# Patient Record
Sex: Female | Born: 2000 | Race: Black or African American | Hispanic: No | Marital: Single | Smoking: Never smoker
Health system: Southern US, Community
[De-identification: ages and names within clinical notes are randomized; demographics above are authoritative.]

## PROBLEM LIST (undated history)

## (undated) ENCOUNTER — Ambulatory Visit: Admission: EM | Payer: Medicaid Other

## (undated) ENCOUNTER — Ambulatory Visit: Source: Home / Self Care

## (undated) HISTORY — PX: NO PAST SURGERIES: SHX2092

---
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
## 2022-01-10 ENCOUNTER — Other Ambulatory Visit: Payer: Self-pay

## 2022-01-10 ENCOUNTER — Ambulatory Visit
Admission: EM | Admit: 2022-01-10 | Discharge: 2022-01-10 | Disposition: A | Discharge status: Other Acute Inpatient Hospital | Payer: Medicaid Other | Attending: Emergency Medicine | Admitting: Emergency Medicine

## 2022-01-10 NOTE — Discharge Instructions (Signed)
NP

## 2022-01-12 ENCOUNTER — Ambulatory Visit: Payer: Medicaid Other

## 2022-02-17 ENCOUNTER — Encounter: Payer: Self-pay | Admitting: Emergency Medicine

## 2022-02-17 ENCOUNTER — Other Ambulatory Visit: Payer: Self-pay

## 2022-02-17 ENCOUNTER — Ambulatory Visit
Admission: EM | Admit: 2022-02-17 | Discharge: 2022-02-17 | Disposition: A | Discharge status: Home / Self Care | Payer: Medicaid Other | Attending: Internal Medicine | Admitting: Internal Medicine

## 2022-02-17 DIAGNOSIS — N898 Other specified noninflammatory disorders of vagina: Secondary | ICD-10-CM | POA: Insufficient documentation

## 2022-02-17 DIAGNOSIS — Z113 Encounter for screening for infections with a predominantly sexual mode of transmission: Secondary | ICD-10-CM | POA: Diagnosis not present

## 2022-02-17 NOTE — ED Triage Notes (Signed)
Pt said right when her period started 5 to 7 days ago she began to have a smell and some discharge. No concern for STD. Pt said hx of BV and yeast infection.  ?

## 2022-02-17 NOTE — ED Provider Notes (Signed)
?[facility] ? ? ? ?CSN: [id number] ?Arrival date & time: 02/17/22  1627 ? ? ?  ? ?History   ?Chief Complaint ?Chief Complaint  ?Patient presents with  ? Vaginal Discharge  ? vaginal smell  ? ? ?HPI ?Deborah Perry is a 21 y.o. female.  ? ?Patient presents with vaginal discharge and vaginal odor that started approximately 5 to 7 days ago at start of menstrual cycle.  Denies urinary burning, urinary frequency, abdominal pain, fever, back pain.  Denies any known exposure to STD but patient is sexually active and has had unprotected sexual intercourse.  Patient reports history of recurrent BV and yeast infection.   ? ? ?Vaginal Discharge ? ?History reviewed. No pertinent past medical history. ? ?Patient Active Problem List  ? Diagnosis Date Noted  ? 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 Vaginal bleeding 12/12/2019  ? Chlamydia 12/12/2019  ? Rash 10/23/2017  ? Obesity 08/21/2015  ? ? ?Past Surgical History:  ?Procedure Laterality Date  ? NO PAST SURGERIES    ? ? ?OB History   ? ? Gravida  ?0  ? Para  ?0  ? Term  ?0  ? Preterm  ?0  ? AB  ?0  ? Living  ?0  ?  ? ? SAB  ?0  ? IAB  ?0  ? Ectopic  ?0  ? Multiple  ?0  ? Live Births  ?0  ?   ?  ?  ? ? ? ?Home Medications   ? ?Prior to Admission medications   ?Medication Sig Start Date End Date Taking? Authorizing Provider  ?norelgestromin-ethinyl estradiol [name]) 150-35 MCG/24HR transdermal patch Place 1 patch onto the skin once a week. 02/01/21   Lajean Manes, CNM  ? ? ?Family History ?Family History  ?Problem Relation Age of Onset  ? Kidney Stones Mother   ? Colon cancer Mother   ? ? ?Social History ?Social History  ? ?Tobacco Use  ? Smoking status: Passive Smoke Exposure - Never Smoker  ? Smokeless tobacco: Never  ?Vaping Use  ? Vaping Use: Never used  ?Substance Use Topics  ? Alcohol use: No  ? Drug use: No  ? ? ? ?Allergies   ?Patient has no known allergies. ? ? ?Review of Systems ?Review of Systems ?Per HPI ? ?Physical Exam ?Triage Vital Signs ?ED Triage Vitals   ?Enc Vitals Group  ?   BP 02/17/22 1734 121/86  ?   Pulse Rate 02/17/22 1734 67  ?   Resp 02/17/22 1734 20  ?   Temp 02/17/22 1734 98.1 ?F (36.7 ?C)  ?   Temp Source 02/17/22 1734 Oral  ?   SpO2 02/17/22 1734 98 %  ?   Weight --   ?   Height --   ?   Head Circumference --   ?   Peak Flow --   ?   Pain Score 02/17/22 1735 0  ?   Pain Loc --   ?   Pain Edu? --   ?   Excl. in [place]? --   ? ?No data found. ? ?Updated Vital Signs ?BP 121/86 (BP Location: Left Arm)   Pulse 67   Temp 98.1 ?F (36.7 ?C) (Oral)   Resp 20   LMP 02/10/2022 (Exact Date)   SpO2 98%  ? ?Visual Acuity ?Right Eye Distance:   ?Left Eye Distance:   ?Bilateral Distance:   ? ?Right Eye Near:   ?Left Eye Near:    ?Bilateral Near:    ? ?  Physical Exam ?Constitutional:   ?   General: She is not in acute distress. ?   Appearance: Normal appearance. She is not toxic-appearing or diaphoretic.  ?HENT:  ?   Head: Normocephalic and atraumatic.  ?Eyes:  ?   Extraocular Movements: Extraocular movements intact.  ?   Conjunctiva/sclera: Conjunctivae normal.  ?Cardiovascular:  ?   Rate and Rhythm: Normal rate and regular rhythm.  ?   Pulses: Normal pulses.  ?   Heart sounds: Normal heart sounds.  ?Pulmonary:  ?   Effort: Pulmonary effort is normal. No respiratory distress.  ?   Breath sounds: Normal breath sounds.  ?Abdominal:  ?   General: Abdomen is flat. Bowel sounds are normal. There is no distension.  ?   Palpations: Abdomen is soft.  ?   Tenderness: There is no abdominal tenderness.  ?Genitourinary: ?   Comments: Deferred with shared decision making.  Self swab performed. ?Neurological:  ?   General: No focal deficit present.  ?   Mental Status: She is alert and oriented to person, place, and time. Mental status is at baseline.  ?Psychiatric:     ?   Mood and Affect: Mood normal.     ?   Behavior: Behavior normal.     ?   Thought Content: Thought content normal.     ?   Judgment: Judgment normal.  ? ? ? ?UC Treatments / Results  ?Labs ?(all labs ordered are  listed, but only abnormal results are displayed) ?Labs Reviewed  ?CERVICOVAGINAL ANCILLARY ONLY  ? ? ?EKG ? ? ?Radiology ?No results found. ? ?Procedures ?Procedures (including critical care time) ? ?Medications Ordered in UC ?Medications - No data to display ? ?Initial Impression / Assessment and Plan / UC Course  ?I have reviewed the triage vital signs and the nursing notes. ? ?Pertinent labs & imaging results that were available during my care of the patient were reviewed by me and considered in my medical decision making (see chart for details). ? ?  ? ?Suspect vaginitis.  Cervicovaginal swab pending.  Will await results for treatment.  Discussed return precautions.  Patient verbalized understanding and was agreeable with plan. ?Final Clinical Impressions(s) / UC Diagnoses  ? ?Final diagnoses:  ?Vaginal discharge  ?Screening examination for venereal disease  ? ? ? ?Discharge Instructions   ? ?  ?Your vaginal swab is pending.  We will call if it is positive and treat as appropriate.  Please refrain from sexual activity until test results and treatment are complete. ? ? ? ?ED Prescriptions   ?None ?  ? ?PDMP not reviewed this encounter. ?  ?Teodora Medici, [place] ?02/17/22 1813 ? ?

## 2022-02-17 NOTE — Discharge Instructions (Signed)
Your vaginal swab is pending.  We will call if it is positive and treat as appropriate.  Please refrain from sexual activity until test results and treatment are complete. ?

## 2022-02-21 ENCOUNTER — Telehealth (HOSPITAL_COMMUNITY): Payer: Self-pay | Admitting: Emergency Medicine

## 2022-02-21 LAB — CERVICOVAGINAL ANCILLARY ONLY
Bacterial Vaginitis (gardnerella): POSITIVE — AB
Candida Glabrata: NEGATIVE
Candida Vaginitis: NEGATIVE
Chlamydia: NEGATIVE
Comment: NEGATIVE
Comment: NEGATIVE
Comment: NEGATIVE
Comment: NEGATIVE
Comment: NEGATIVE
Comment: NORMAL
Neisseria Gonorrhea: NEGATIVE
Trichomonas: NEGATIVE

## 2022-02-21 MED ORDER — METRONIDAZOLE 500 MG PO TABS: 500.0000 mg | ORAL_TABLET | Freq: Two times a day (BID) | ORAL | 0 refills | Status: DC

## 2022-03-16 ENCOUNTER — Telehealth: Payer: Medicaid Other | Admitting: Physician Assistant

## 2022-03-16 DIAGNOSIS — N760 Acute vaginitis: Secondary | ICD-10-CM | POA: Diagnosis not present

## 2022-03-16 DIAGNOSIS — B9689 Other specified bacterial agents as the cause of diseases classified elsewhere: Secondary | ICD-10-CM

## 2022-03-16 MED ORDER — METRONIDAZOLE 0.75 % VA GEL: 1.0000 | Freq: Two times a day (BID) | VAGINAL | 0 refills | Status: DC

## 2022-03-16 NOTE — Patient Instructions (Signed)
?  Chauncey Cruel, thank you for joining Margaretann Loveless, PA-C for today's virtual visit.  While this provider is not your primary care provider (PCP), if your PCP is located in our provider database this encounter information will be shared with them immediately following your visit. ? ?Consent: ?(Patient) Deborah Perry provided verbal consent for this virtual visit at the beginning of the encounter. ? ?Current Medications: ? ?Current Outpatient Medications:  ?  metroNIDAZOLE (METROGEL VAGINAL) 0.75 % vaginal gel, Place 1 Applicatorful vaginally 2 (two) times daily. X 5 days, Disp: 140 g, Rfl: 0 ?  norelgestromin-ethinyl estradiol Burr Medico) 150-35 MCG/24HR transdermal patch, Place 1 patch onto the skin once a week., Disp: 3 patch, Rfl: 12  ? ?Medications ordered in this encounter:  ?Meds ordered this encounter  ?Medications  ? metroNIDAZOLE (METROGEL VAGINAL) 0.75 % vaginal gel  ?  Sig: Place 1 Applicatorful vaginally 2 (two) times daily. X 5 days  ?  Dispense:  140 g  ?  Refill:  0  ?  Order Specific Question:   Supervising Provider  ?  Answer:   Eber Hong [[id number]]  ?  ? ?*If you need refills on other medications prior to your next appointment, please contact your pharmacy* ? ?Follow-Up: ?Call back or seek an in-person evaluation if the symptoms worsen or if the condition fails to improve as anticipated. ? ?Other Instructions ?Vaginal Probiotics: ?AZO vaginal probiotic ?OLLY Happy Hoo-Ha ?RAW Vaginal Care ?RenewLife Women's vaginal probiotic ?RepHresh Pro-B ? ?Vaginal washes: ?Honey Pot ?Summer's Eve ?Vagisil Feminine cleanser ? ? ?If you have been instructed to have an in-person evaluation today at a local Urgent Care facility, please use the link below. It will take you to a list of all of our available [facility], including address, phone number and hours of operation. Please do not delay care.  ?[facility] ? ?If you or a family member do not have a primary care provider,  use the link below to schedule a visit and establish care. When you choose a [facility] primary care physician or advanced practice provider, you gain a long-term partner in health. ?Find a Primary Care Provider ? ?Learn more about [facility]'s in-office and virtual care options: ?[facility] - Get Care Now ?

## 2022-03-16 NOTE — Progress Notes (Signed)
?Virtual Visit Consent  ? ?Deborah Perry, you are scheduled for a virtual visit with a [facility] provider today.   ?  ?Just as with appointments in the office, your consent must be obtained to participate.  Your consent will be active for this visit and any virtual visit you may have with one of our providers in the next 365 days.   ?  ?If you have a MyChart account, a copy of this consent can be sent to you electronically.  All virtual visits are billed to your insurance company just like a traditional visit in the office.   ? ?As this is a virtual visit, video technology does not allow for your provider to perform a traditional examination.  This may limit your provider's ability to fully assess your condition.  If your provider identifies any concerns that need to be evaluated in person or the need to arrange testing (such as labs, EKG, etc.), we will make arrangements to do so.   ?  ?Although advances in technology are sophisticated, we cannot ensure that it will always work on either your end or our end.  If the connection with a video visit is poor, the visit may have to be switched to a telephone visit.  With either a video or telephone visit, we are not always able to ensure that we have a secure connection.    ? ?I need to obtain your verbal consent now.   Are you willing to proceed with your visit today?  ?  ?Deborah Perry has provided verbal consent on 03/16/2022 for a virtual visit (video or telephone). ?  ?Deborah Daring, PA-C  ? ?Date: 03/16/2022 1:26 PM ? ? ?Virtual Visit via Video Note  ? Deborah Perry, connected with  Deborah Perry  ([id number], Jan 02, 2001) on 03/16/22 at  1:15 PM EDT by a video-enabled telemedicine application and verified that I am speaking with the correct person using two identifiers. ? ?Location: ?Patient: Virtual Visit Location Patient: Home ?Provider: Virtual Visit Location Provider: Home Office ?  ?I discussed the limitations of evaluation and  management by telemedicine and the availability of in person appointments. The patient expressed understanding and agreed to proceed.   ? ?History of Present Illness: ?Deborah Perry is a 21 y.o. who identifies as a female who was assigned female at birth, and is being seen today for vaginal discharge. ? ?HPI: Vaginal Discharge ?The patient's primary symptoms include a genital odor and vaginal discharge. This is a recurrent problem. The current episode started 1 to 4 weeks ago. The problem occurs constantly. The problem has been unchanged. The patient is experiencing no pain. She is not pregnant. Pertinent negatives include no back pain, chills, fever, flank pain, frequency or nausea. The vaginal discharge was watery, white and malodorous. There has been no bleeding. Nothing aggravates the symptoms. She has tried antibiotics for the symptoms. The treatment provided no relief. She is sexually active (not since last testing).   ? ? ?Problems:  ?Patient Active Problem List  ? Diagnosis Date Noted  ? 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 Vaginal bleeding 12/12/2019  ? Chlamydia 12/12/2019  ? Rash 10/23/2017  ? Obesity 08/21/2015  ?  ?Allergies: No Known Allergies ?Medications:  ?Current Outpatient Medications:  ?  metroNIDAZOLE (METROGEL VAGINAL) 0.75 % vaginal gel, Place 1 Applicatorful vaginally 2 (two) times daily. X 5 days, Disp: 140 g, Rfl: 0 ?  norelgestromin-ethinyl estradiol [name]) 150-35 MCG/24HR transdermal patch, Place 1 patch onto the skin once a week.,  Disp: 3 patch, Rfl: 12 ? ?Observations/Objective: ?Patient is well-developed, well-nourished in no acute distress.  ?Resting comfortably at home.  ?Head is normocephalic, atraumatic.  ?No labored breathing.  ?Speech is clear and coherent with logical content.  ?Patient is alert and oriented at baseline.  ? ? ?Assessment and Plan: ?1. BV (bacterial vaginosis) ?- metroNIDAZOLE (METROGEL VAGINAL) 0.75 % vaginal gel; Place 1 Applicatorful vaginally 2 (two) times daily.  X 5 days  Dispense: 140 g; Refill: 0 ? ?- Recently treated and symptoms cleared, but started menstrual cycle and all symptoms returned near ending of menstrual cycle ?- Metrogel prescribed ?- Discussed vaginal hygiene ?- Follow up as needed ? ?Follow Up Instructions: ?I discussed the assessment and treatment plan with the patient. The patient was provided an opportunity to ask questions and all were answered. The patient agreed with the plan and demonstrated an understanding of the instructions.  A copy of instructions were sent to the patient via MyChart unless otherwise noted below.  ? ? ?The patient was advised to call back or seek an in-person evaluation if the symptoms worsen or if the condition fails to improve as anticipated. ? ?Time:  ?I spent 11 minutes with the patient via telehealth technology discussing the above problems/concerns.   ? ?Deborah Daring, PA-C ?

## 2022-05-26 ENCOUNTER — Other Ambulatory Visit: Payer: Self-pay | Admitting: Physician Assistant

## 2022-05-26 DIAGNOSIS — B9689 Other specified bacterial agents as the cause of diseases classified elsewhere: Secondary | ICD-10-CM

## 2022-06-16 ENCOUNTER — Telehealth: Payer: Medicaid Other | Admitting: Physician Assistant

## 2022-06-16 DIAGNOSIS — R3989 Other symptoms and signs involving the genitourinary system: Secondary | ICD-10-CM

## 2022-06-17 MED ORDER — CEPHALEXIN 500 MG PO CAPS: 500.0000 mg | ORAL_CAPSULE | Freq: Two times a day (BID) | ORAL | 0 refills | Status: AC

## 2022-06-17 NOTE — Progress Notes (Signed)
I have spent 5 minutes in review of e-visit questionnaire, review and updating patient chart, medical decision making and response to patient.   Arif Amendola Cody Nahun Kronberg, PA-C    

## 2022-06-17 NOTE — Progress Notes (Signed)

## 2022-07-06 ENCOUNTER — Telehealth: Payer: Medicaid Other | Admitting: Family Medicine

## 2022-07-06 DIAGNOSIS — R300 Dysuria: Secondary | ICD-10-CM | POA: Diagnosis not present

## 2022-07-07 NOTE — Progress Notes (Signed)
[place]   Recent UTI- reoccurring in under 30 days- needs to have testing to get best medication/ treatment plan

## 2022-07-08 MED ORDER — NITROFURANTOIN MONOHYD MACRO 100 MG PO CAPS: 100.0000 mg | ORAL_CAPSULE | Freq: Two times a day (BID) | ORAL | 0 refills | Status: DC

## 2022-07-08 NOTE — Addendum Note (Signed)
Addended by: Margaretann Loveless on: 07/08/2022 02:19 PM   Modules accepted: Orders, Level of Service

## 2022-07-08 NOTE — Progress Notes (Signed)
E-Visit for Urinary Problems  We are sorry that you are not feeling well.  Here is how we plan to help!  Based on what you shared with me it looks like you most likely have a simple urinary tract infection.  A UTI (Urinary Tract Infection) is a bacterial infection of the bladder.  Most cases of urinary tract infections are simple to treat but a key part of your care is to encourage you to drink plenty of fluids and watch your symptoms carefully.  I have prescribed MacroBid 100 mg twice a day for 5 days.  Your symptoms should gradually improve. Call us if the burning in your urine worsens, you develop worsening fever, back pain or pelvic pain or if your symptoms do not resolve after completing the antibiotic.  Urinary tract infections can be prevented by drinking plenty of water to keep your body hydrated.  Also be sure when you wipe, wipe from front to back and don't hold it in!  If possible, empty your bladder every 4 hours.  We will treat this time, but if recurs again in less than 30 days, you must be seen in person. Also, make sure to disinfect toys with appropriate cleaners to avoid reinfection.   HOME CARE Drink plenty of fluids Compete the full course of the antibiotics even if the symptoms resolve Remember, when you need to go.go. Holding in your urine can increase the likelihood of getting a UTI! GET HELP RIGHT AWAY IF: You cannot urinate You get a high fever Worsening back pain occurs You see blood in your urine You feel sick to your stomach or throw up You feel like you are going to pass out  MAKE SURE YOU  Understand these instructions. Will watch your condition. Will get help right away if you are not doing well or get worse.   Thank you for choosing an e-visit.  Your e-visit answers were reviewed by a board certified advanced clinical practitioner to complete your personal care plan. Depending upon the condition, your plan could have included both over the counter or  prescription medications.  Please review your pharmacy choice. Make sure the pharmacy is open so you can pick up prescription now. If there is a problem, you may contact your provider through [employer] and have the prescription routed to another pharmacy.  Your safety is important to [place]. If you have drug allergies check your prescription carefully.   For the next 24 hours you can use MyChart to ask questions about today's visit, request a non-urgent call back, or ask for a work or school excuse. You will get an email in the next two days asking about your experience. I hope that your e-visit has been valuable and will speed your recovery.  I provided 5 minutes of non face-to-face time during this encounter for chart review and documentation.

## 2022-09-03 ENCOUNTER — Encounter: Payer: Self-pay | Admitting: Emergency Medicine

## 2022-09-03 ENCOUNTER — Ambulatory Visit
Admission: EM | Admit: 2022-09-03 | Discharge: 2022-09-03 | Disposition: A | Discharge status: Home / Self Care | Payer: Medicaid Other | Attending: Physician Assistant | Admitting: Physician Assistant

## 2022-09-03 DIAGNOSIS — N3000 Acute cystitis without hematuria: Secondary | ICD-10-CM | POA: Insufficient documentation

## 2022-09-03 LAB — POCT URINALYSIS DIP (MANUAL ENTRY)
Bilirubin, UA: NEGATIVE
Glucose, UA: NEGATIVE mg/dL
Ketones, POC UA: NEGATIVE mg/dL
Nitrite, UA: NEGATIVE
Protein Ur, POC: 100 mg/dL — AB
Spec Grav, UA: 1.025 (ref 1.010–1.025)
Urobilinogen, UA: 0.2 E.U./dL
pH, UA: 7 (ref 5.0–8.0)

## 2022-09-03 LAB — POCT URINE PREGNANCY: Preg Test, Ur: NEGATIVE

## 2022-09-03 MED ORDER — NITROFURANTOIN MONOHYD MACRO 100 MG PO CAPS: 100.0000 mg | ORAL_CAPSULE | Freq: Two times a day (BID) | ORAL | 0 refills | Status: DC

## 2022-09-03 NOTE — ED Provider Notes (Signed)
[name]    CSN: [id number] Arrival date & time: 09/03/22  3875      History   Chief Complaint Chief Complaint  Patient presents with   Urinary Tract Infection    HPI Deborah Perry is a 21 y.o. female.   Patient here today for evaluation of possible UTI.  She reports that for the last week she has had some discomfort with urination as well as urinary frequency.  She reports yesterday symptoms worsened and she has had some cramping.  She has not had any back pain.  She denies any fever or chills.  She denies any nausea or vomiting.  She does not report treatment for symptoms.  The history is provided by the patient.    History reviewed. No pertinent past medical history.  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 Authorizing Provider  nitrofurantoin, macrocrystal-monohydrate, (MACROBID) 100 MG capsule Take 1 capsule (100 mg total) by mouth 2 (two) times daily. 09/03/22  Yes Francene Finders, PA-C  norelgestromin-ethinyl estradiol [name]) 150-35 MCG/24HR transdermal patch Place 1 patch onto the skin once a week. 02/01/21   Lajean Manes, CNM    Family History Family History  Problem Relation Age of Onset   Kidney Stones Mother    Colon cancer Mother     Social History Social History   Tobacco Use   Smoking status: Passive Smoke Exposure - Never Smoker   Smokeless tobacco: Never  Vaping Use   Vaping Use: Never used  Substance Use Topics   Alcohol use: No    Drug use: No     Allergies   Patient has no known allergies.   Review of Systems Review of Systems  Constitutional:  Negative for chills and fever.  Eyes:  Negative for discharge and redness.  Gastrointestinal:  Positive for abdominal pain. Negative for nausea and vomiting.  Genitourinary:  Positive for dysuria and frequency.     Physical Exam Triage Vital Signs ED Triage Vitals  Enc Vitals Group     BP      Pulse      Resp      Temp      Temp src      SpO2      Weight      Height      Head Circumference      Peak Flow      Pain Score      Pain Loc      Pain Edu?      Excl. in [place]?    No data found.  Updated Vital Signs BP 116/75 (BP Location: Left Arm)   Pulse 66   Temp 98.5 F (36.9 C) (Oral)  Resp 16   LMP 08/03/2022   SpO2 99%      Physical Exam Vitals and nursing note reviewed.  Constitutional:      General: She is not in acute distress.    Appearance: Normal appearance. She is not ill-appearing.  HENT:     Head: Normocephalic and atraumatic.  Eyes:     Conjunctiva/sclera: Conjunctivae normal.  Cardiovascular:     Rate and Rhythm: Normal rate.  Pulmonary:     Effort: Pulmonary effort is normal.  Neurological:     Mental Status: She is alert.  Psychiatric:        Mood and Affect: Mood normal.        Behavior: Behavior normal.        Thought Content: Thought content normal.      UC Treatments / Results  Labs (all labs ordered are listed, but only abnormal results are displayed) Labs Reviewed  POCT URINALYSIS DIP (MANUAL ENTRY) - Abnormal; Notable for the following components:      Result Value   Color, UA light yellow (*)    Clarity, UA cloudy (*)    Blood, UA moderate (*)    Protein Ur, POC =100 (*)    Leukocytes, UA Small (1+) (*)    All other components within normal limits  URINE CULTURE  POCT URINE PREGNANCY    EKG   Radiology No results found.  Procedures Procedures (including critical care time)  Medications  Ordered in UC Medications - No data to display  Initial Impression / Assessment and Plan / UC Course  I have reviewed the triage vital signs and the nursing notes.  Pertinent labs & imaging results that were available during my care of the patient were reviewed by me and considered in my medical decision making (see chart for details).    We will treat to cover UTI given UA results. Urine pregnancy test negative.  Urine culture ordered.  Recommended follow-up with any further concerns.  Final Clinical Impressions(s) / UC Diagnoses   Final diagnoses:  Acute cystitis without hematuria   Discharge Instructions   None    ED Prescriptions     Medication Sig Dispense Auth. Provider   nitrofurantoin, macrocrystal-monohydrate, (MACROBID) 100 MG capsule Take 1 capsule (100 mg total) by mouth 2 (two) times daily. 10 capsule Tomi Bamberger, PA-C      PDMP not reviewed this encounter.   Tomi Bamberger, PA-C 09/03/22 [phone]

## 2022-09-03 NOTE — ED Triage Notes (Signed)
Pt said x 1 week has had some urination discomfort and frequency but yesterday got worse with discomfort and feeling she needs to keep going and not emptying her bladder.

## 2022-09-05 LAB — URINE CULTURE: Culture: >=100000 — AB

## 2022-10-10 NOTE — Progress Notes (Signed)
Erroneous encounter-disregard

## 2022-10-17 ENCOUNTER — Encounter: Payer: Medicaid Other | Admitting: Family

## 2022-10-17 DIAGNOSIS — Z7689 Persons encountering health services in other specified circumstances: Secondary | ICD-10-CM

## 2022-11-02 ENCOUNTER — Ambulatory Visit
Admission: EM | Admit: 2022-11-02 | Discharge: 2022-11-02 | Disposition: A | Discharge status: Home / Self Care | Payer: Medicaid Other | Attending: Physician Assistant | Admitting: Physician Assistant

## 2022-11-02 DIAGNOSIS — N898 Other specified noninflammatory disorders of vagina: Secondary | ICD-10-CM | POA: Diagnosis not present

## 2022-11-02 MED ORDER — FLUCONAZOLE 150 MG PO TABS: ORAL_TABLET | ORAL | 0 refills | Status: DC

## 2022-11-02 NOTE — ED Triage Notes (Signed)
Pt c/o vaginal odor and clumpy discharge onset ~ 1 week ago concerned for either yeast or BV states she gets these often. Not relieved w/ cranberry juice.

## 2022-11-02 NOTE — ED Provider Notes (Signed)
[name]    CSN: [id number] Arrival date & time: 11/02/22  1901      History   Chief Complaint Chief Complaint  Patient presents with   Vaginal Discharge    HPI Deborah Perry is a 21 y.o. female.   Patient here today for evaluation of vaginal discharge that is clumpy. She reports symptoms are similar to yeast or BV infections but states her discharge does not smell as bad as BV typically does. She has not had any genital rashes or lesions. She denies any abdominal pain or low back pain, nausea, vomiting, dysuria. She does not report treatment for symptoms.   The history is provided by the patient.  Vaginal Discharge Associated symptoms: no abdominal pain, no dysuria, no fever, no nausea and no vomiting     History reviewed. No pertinent past medical history.  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 Authorizing Provider  fluconazole (DIFLUCAN) 150 MG tablet Take one tab PO today and repeat dose in 3 days if symptoms persist 11/02/22  Yes Tomi Bamberger, PA-C  nitrofurantoin, macrocrystal-monohydrate, (MACROBID) 100 MG capsule Take 1 capsule (100 mg total) by mouth 2 (two) times daily. 09/03/22   Tomi Bamberger, PA-C  norelgestromin-ethinyl estradiol [employer] Medico) 150-35 MCG/24HR transdermal patch Place 1 patch onto the skin once a week. 02/01/21   Sharyon Cable, CNM    Family History Family History  Problem Relation Age of Onset   Kidney Stones Mother    Colon  cancer Mother     Social History Social History   Tobacco Use   Smoking status: Passive Smoke Exposure - Never Smoker   Smokeless tobacco: Never  Vaping Use   Vaping Use: Never used  Substance Use Topics   Alcohol use: No   Drug use: No     Allergies   Patient has no known allergies.   Review of Systems Review of Systems  Constitutional:  Negative for chills and fever.  Eyes:  Negative for discharge and redness.  Gastrointestinal:  Negative for abdominal pain, nausea and vomiting.  Genitourinary:  Positive for vaginal discharge. Negative for dysuria.     Physical Exam Triage Vital Signs ED Triage Vitals [11/02/22 1909]  Enc Vitals Group     BP 113/71     Pulse Rate 68     Resp 16     Temp 98 F (36.7 C)     Temp Source Oral     SpO2 98 %     Weight      Height      Head Circumference      Peak Flow      Pain Score 0     Pain Loc  Pain Edu?      Excl. in [place]?    No data found.  Updated Vital Signs BP 113/71 (BP Location: Left Arm)   Pulse 68   Temp 98 F (36.7 C) (Oral)   Resp 16   SpO2 98%     Physical Exam Vitals and nursing note reviewed.  Constitutional:      General: She is not in acute distress.    Appearance: Normal appearance. She is not ill-appearing.  HENT:     Head: Normocephalic and atraumatic.  Eyes:     Conjunctiva/sclera: Conjunctivae normal.  Cardiovascular:     Rate and Rhythm: Normal rate.  Pulmonary:     Effort: Pulmonary effort is normal.  Neurological:     Mental Status: She is alert.  Psychiatric:        Mood and Affect: Mood normal.        Behavior: Behavior normal.        Thought Content: Thought content normal.      UC Treatments / Results  Labs (all labs ordered are listed, but only abnormal results are displayed) Labs Reviewed  CERVICOVAGINAL ANCILLARY ONLY    EKG   Radiology No results found.  Procedures Procedures (including critical care time)  Medications Ordered in UC Medications -  No data to display  Initial Impression / Assessment and Plan / UC Course  I have reviewed the triage vital signs and the nursing notes.  Pertinent labs & imaging results that were available during my care of the patient were reviewed by me and considered in my medical decision making (see chart for details).   Diflucan prescribed as symptoms seem consistent with yeast vaginitis. Will also screen for BV, trichomonas, gonorrhea and chlamydia. Recommend follow up with any further concerns, otherwise will await results for further recommendation.    Final Clinical Impressions(s) / UC Diagnoses   Final diagnoses:  Vaginal discharge   Discharge Instructions   None    ED Prescriptions     Medication Sig Dispense Auth. Provider   fluconazole (DIFLUCAN) 150 MG tablet Take one tab PO today and repeat dose in 3 days if symptoms persist 2 tablet Tomi Bamberger, PA-C      PDMP not reviewed this encounter.   Tomi Bamberger, PA-C 11/02/22 1932

## 2022-11-04 ENCOUNTER — Telehealth (HOSPITAL_COMMUNITY): Payer: Self-pay | Admitting: Physician Assistant

## 2022-11-04 LAB — CERVICOVAGINAL ANCILLARY ONLY
Bacterial Vaginitis (gardnerella): POSITIVE — AB
Candida Glabrata: NEGATIVE
Candida Vaginitis: NEGATIVE
Chlamydia: NEGATIVE
Comment: NEGATIVE
Comment: NEGATIVE
Comment: NEGATIVE
Comment: NEGATIVE
Comment: NEGATIVE
Comment: NORMAL
Neisseria Gonorrhea: NEGATIVE
Trichomonas: NEGATIVE

## 2022-11-04 MED ORDER — METRONIDAZOLE 500 MG PO TABS: 500.0000 mg | ORAL_TABLET | Freq: Two times a day (BID) | ORAL | 0 refills | Status: DC

## 2022-11-04 NOTE — Telephone Encounter (Signed)
Positive for BV. Will send in treatment. Metronidazole prescribed.

## 2022-11-22 ENCOUNTER — Other Ambulatory Visit (HOSPITAL_COMMUNITY)
Admission: RE | Admit: 2022-11-22 | Discharge: 2022-11-22 | Disposition: A | Discharge status: Home / Self Care | Payer: Medicaid Other | Source: Ambulatory Visit | Attending: Obstetrics and Gynecology | Admitting: Obstetrics and Gynecology

## 2022-11-22 ENCOUNTER — Ambulatory Visit (INDEPENDENT_AMBULATORY_CARE_PROVIDER_SITE_OTHER): Payer: Medicaid Other | Admitting: Licensed Clinical Social Worker

## 2022-11-22 ENCOUNTER — Encounter: Payer: Self-pay | Admitting: Obstetrics and Gynecology

## 2022-11-22 ENCOUNTER — Ambulatory Visit (INDEPENDENT_AMBULATORY_CARE_PROVIDER_SITE_OTHER): Payer: Medicaid Other | Admitting: Obstetrics and Gynecology

## 2022-11-22 VITALS — BP 128/72 | HR 82 | Ht 65.0 in | Wt 162.0 lb

## 2022-11-22 DIAGNOSIS — Z113 Encounter for screening for infections with a predominantly sexual mode of transmission: Secondary | ICD-10-CM

## 2022-11-22 DIAGNOSIS — Z659 Problem related to unspecified psychosocial circumstances: Secondary | ICD-10-CM | POA: Diagnosis not present

## 2022-11-22 DIAGNOSIS — F411 Generalized anxiety disorder: Secondary | ICD-10-CM | POA: Diagnosis not present

## 2022-11-22 DIAGNOSIS — Z01419 Encounter for gynecological examination (general) (routine) without abnormal findings: Secondary | ICD-10-CM | POA: Diagnosis not present

## 2022-11-22 DIAGNOSIS — N760 Acute vaginitis: Secondary | ICD-10-CM

## 2022-11-22 NOTE — Progress Notes (Signed)
NGYN presents for annual and all STD testing. Reports reoccurring BV infections and yeast infections.  Pt is interested in counseling.  No BC desired

## 2022-11-22 NOTE — Patient Instructions (Addendum)
If today's test come's back positive for BV as well, we will do prolonged (several weeks) of treatment to try to clear it.

## 2022-11-22 NOTE — Progress Notes (Signed)
NEW GYNECOLOGY PATIENT Patient name: Deborah Perry MRN [id number]  Date of birth: 2001-10-31 Chief Complaint:   No chief complaint on file.     History:  Deborah Perry is a 21 y.o. G0P0000 being seen today for annual and recurrent BV.    Patient reports a history of at least 6 outbreaks of BV in the last year.  She has done both oral and vaginal treatments in the past.  She has noticed that she may have infection after her menses; also noted with different condom brand which she has stopped using.  Infections are usually associated with bad odor, no itching.  Currently sexually active with 1 female partner.  Currently using condoms for contraception.  Reports having previously tried boric acid for BV, with subsequent change in her menses and has not taken it again.  Has attempted OTC medications and treatments for recurrent BV. Last infection a few weeks ago, noted to have a UTI with subsequent yeast infection due to ABX use.       Gynecologic History Patient's last menstrual period was 11/12/2022. Contraception: condoms Last Pap: n/a Last Mammogram: n/a  Last Colonoscopy: n/a  Obstetric History OB History  Gravida Para Term Preterm AB Living  0 0 0 0 0 0  SAB IAB Ectopic Multiple Live Births  0 0 0 0 0    History reviewed. No pertinent past medical history.  Past Surgical History:  Procedure Laterality Date   NO PAST SURGERIES      Current Outpatient Medications on File Prior to Visit  Medication Sig Dispense Refill   fluconazole (DIFLUCAN) 150 MG tablet Take one tab PO today and repeat dose in 3 days if symptoms persist 2 tablet 0   metroNIDAZOLE (FLAGYL) 500 MG tablet Take 1 tablet (500 mg total) by mouth 2 (two) times daily. 14 tablet 0   nitrofurantoin, macrocrystal-monohydrate, (MACROBID) 100 MG capsule Take 1 capsule (100 mg total) by mouth 2 (two) times daily. 10 capsule 0   norelgestromin-ethinyl estradiol Burr Medico) 150-35 MCG/24HR transdermal patch Place 1 patch  onto the skin once a week. 3 patch 12   No current facility-administered medications on file prior to visit.    No Known Allergies  Social History:  reports that she is a non-smoker but has been exposed to tobacco smoke. She has never used smokeless tobacco. She reports that she does not drink alcohol and does not use drugs.  Family History  Problem Relation Age of Onset   Hypertension Mother    Diabetes Mother    Kidney Stones Mother    Colon cancer Mother    Stroke Maternal Grandfather     The following portions of the patient's history were reviewed and updated as appropriate: allergies, current medications, past family history, past medical history, past social history, past surgical history and problem list.  Review of Systems Pertinent items noted in HPI and remainder of comprehensive ROS otherwise negative.  Physical Exam:  BP 128/72   Pulse 82   Ht 5\' 5"  (1.651 m)   Wt 162 lb (73.5 kg)   LMP 11/12/2022   BMI 26.96 kg/m  Physical Exam Vitals and nursing note reviewed. Exam conducted with a chaperone present.  Constitutional:      Appearance: Normal appearance.  Pulmonary:     Effort: Pulmonary effort is normal.  Genitourinary:    General: Normal vulva.     Exam position: Lithotomy position.     Labia:        Right: No  rash.        Left: No rash.      Urethra: No prolapse.     Comments: Normal bilateral abia majora Nonerythematou vaginal introitus White discharge noted  Neurological:     General: No focal deficit present.     Mental Status: She is alert and oriented to person, place, and time.  Psychiatric:        Mood and Affect: Mood normal.        Behavior: Behavior normal.        Thought Content: Thought content normal.        Judgment: Judgment normal.      Assessment and Plan:   1. Routine screening for STI (sexually transmitted infection) - Hepatitis C Antibody - Hepatitis B Surface AntiGEN - RPR - HIV antibody (with reflex) -  Cervicovaginal ancillary only( [place])  2. Well woman exam No menstrual or contraceptive concerns  3. Other social stressor - Ambulatory referral to Integrated Behavioral Health  4. Recurrent vaginitis Test today, follow up results Discuss possible suppressive therapy if positive testing today  Avoidance of perfumed products in vaginal area Possible post-menstrual boric acid Review of vulvovagi     Routine preventative health maintenance measures emphasized. Please refer to After Visit Summary for other counseling recommendations.   Follow-up: No follow-ups on file.      Lorriane Shire, MD Obstetrician & Gynecologist, Faculty Practice [facility] for [employer], [facility]

## 2022-11-23 LAB — CERVICOVAGINAL ANCILLARY ONLY
Bacterial Vaginitis (gardnerella): NEGATIVE
Candida Glabrata: NEGATIVE
Candida Vaginitis: POSITIVE — AB
Chlamydia: NEGATIVE
Comment: NEGATIVE
Comment: NEGATIVE
Comment: NEGATIVE
Comment: NEGATIVE
Comment: NEGATIVE
Comment: NORMAL
Neisseria Gonorrhea: NEGATIVE
Trichomonas: NEGATIVE

## 2022-11-23 LAB — HIV ANTIBODY (ROUTINE TESTING W REFLEX): HIV Screen 4th Generation wRfx: NONREACTIVE

## 2022-11-23 LAB — RPR: RPR Ser Ql: NONREACTIVE

## 2022-11-23 LAB — HEPATITIS B SURFACE ANTIGEN: Hepatitis B Surface Ag: NEGATIVE

## 2022-11-23 LAB — HEPATITIS C ANTIBODY: Hep C Virus Ab: NONREACTIVE

## 2022-11-23 NOTE — BH Specialist Note (Signed)
Integrated Behavioral Health Initial In-Person Visit  MRN: [id number] Name: Deborah Perry  Number of Integrated Behavioral Health Clinician visits: 1 Session Start time:   2:10pm Session End time: 2:53pm Total time in minutes: 43 mins in person at [facility]  Types of Service: Individual psychotherapy  Interpretor:No. Interpretor Name and Language: none   Warm Hand Off Completed.        Subjective: Deborah Perry is a 21 y.o. female accompanied by n/a Patient was referred by Dr Briscoe Deutscher for anxiety. Patient reports the following symptoms/concerns:  Duration of problem: approx 6 months; Severity of problem: mild  Objective: Mood: good and Affect: Appropriate Risk of harm to self or others: No plan to harm self or others  Life Context: Family and Social: Lives with boyfriend  School/Work: Work at home  Self-Care: n/a Life Changes: Moved into new apartment  Patient and/or Family's Strengths/Protective Factors: Concrete supports in place (healthy food, safe environments, etc.)  Goals Addressed: Patient will: Reduce symptoms of: anxiety Increase knowledge and/or ability of: coping skills  Demonstrate ability to: Increase healthy adjustment to current life circumstances  Progress towards Goals: Ongoing  Interventions: Interventions utilized: Motivational Interviewing and Supportive Counseling  Standardized Assessments completed: Not Needed  Patient and/or Family Response: Ms. Bernath reports feeling anxious, and often worries about various things and feeling nervious.   Assessment: Patient currently experiencing general anxiety disorder.   Patient may benefit from integrated behavioral health.  Plan: Follow up with behavioral health clinician on : as needed Behavioral recommendations: Practice mindfulness and deep breathing exercise. Communicate needs for support   Referral(s): Integrated [facility] (In Clinic) "From scale of 1-10, how likely  are you to follow plan?":    Gwyndolyn Saxon, LCSW

## 2022-11-24 ENCOUNTER — Other Ambulatory Visit: Payer: Self-pay | Admitting: Obstetrics and Gynecology

## 2022-11-24 DIAGNOSIS — N760 Acute vaginitis: Secondary | ICD-10-CM

## 2022-11-24 MED ORDER — FLUCONAZOLE 150 MG PO TABS: 150.0000 mg | ORAL_TABLET | Freq: Once | ORAL | 3 refills | Status: AC

## 2022-12-02 ENCOUNTER — Telehealth: Payer: Medicaid Other | Admitting: Family Medicine

## 2022-12-02 DIAGNOSIS — N760 Acute vaginitis: Secondary | ICD-10-CM

## 2022-12-02 NOTE — Progress Notes (Signed)
[place]   Recurrent  and recent treatment that failed

## 2022-12-05 ENCOUNTER — Inpatient Hospital Stay: Admission: RE | Admit: 2022-12-05 | Payer: Medicaid Other | Source: Ambulatory Visit

## 2023-01-03 ENCOUNTER — Ambulatory Visit
Admission: RE | Admit: 2023-01-03 | Discharge: 2023-01-03 | Disposition: A | Discharge status: Home / Self Care | Payer: Medicaid Other | Source: Ambulatory Visit | Attending: Physician Assistant | Admitting: Physician Assistant

## 2023-01-03 ENCOUNTER — Ambulatory Visit: Payer: Medicaid Other

## 2023-01-03 VITALS — BP 139/74 | HR 83 | Temp 98.3°F | Resp 18

## 2023-01-03 DIAGNOSIS — Z3202 Encounter for pregnancy test, result negative: Secondary | ICD-10-CM | POA: Diagnosis not present

## 2023-01-03 DIAGNOSIS — N898 Other specified noninflammatory disorders of vagina: Secondary | ICD-10-CM | POA: Insufficient documentation

## 2023-01-03 LAB — POCT URINE PREGNANCY: Preg Test, Ur: NEGATIVE

## 2023-01-03 NOTE — ED Provider Notes (Signed)
[name]    CSN: [id number] Arrival date & time: 01/03/23  1446      History   Chief Complaint Chief Complaint  Patient presents with   Vaginal Odor    HPI Deborah Perry is a 22 y.o. female.   Patient here today for evaluation of abnormal vaginal discharge with odor that started 2 days ago. She has not  had any genital lesions or rash. She was treated for both yeast as well as BV in the past few months. She requests screening for pregnancy.   The history is provided by the patient.    History reviewed. No pertinent past medical history.  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 Authorizing Provider  fluconazole (DIFLUCAN) 150 MG tablet Take one tab PO today and repeat dose in 3 days if symptoms persist 11/02/22   Francene Finders, PA-C  metroNIDAZOLE (FLAGYL) 500 MG tablet Take 1 tablet (500 mg total) by mouth 2 (two) times daily. 11/04/22   Francene Finders, PA-C  nitrofurantoin, macrocrystal-monohydrate, (MACROBID) 100 MG capsule Take 1 capsule (100 mg total) by mouth 2 (two) times daily. 09/03/22   Francene Finders, PA-C  norelgestromin-ethinyl estradiol [name]) 150-35 MCG/24HR transdermal patch Place 1 patch onto the skin once a week. 02/01/21   Lajean Manes, CNM    Family History Family History  Problem Relation Age of Onset   Hypertension Mother    Diabetes Mother    Kidney Stones Mother    Colon cancer Mother    Stroke Maternal Grandfather      Social History Social History   Tobacco Use   Smoking status: Passive Smoke Exposure - Never Smoker   Smokeless tobacco: Never  Vaping Use   Vaping Use: Never used  Substance Use Topics   Alcohol use: No   Drug use: No     Allergies   Patient has no known allergies.   Review of Systems Review of Systems  Constitutional:  Negative for chills and fever.  Eyes:  Negative for discharge and redness.  Respiratory:  Negative for shortness of breath.   Gastrointestinal:  Negative for abdominal pain, nausea and vomiting.  Genitourinary:  Positive for vaginal discharge. Negative for genital sores.     Physical Exam Triage Vital Signs ED Triage Vitals  Enc Vitals Group     BP 01/03/23 1458 139/74     Pulse Rate 01/03/23 1458 83     Resp 01/03/23 1458 18     Temp 01/03/23 1458 98.3 F (36.8 C)     Temp Source 01/03/23 1458 Oral     SpO2 01/03/23 1458 98 %     Weight --      Height --  Head Circumference --      Peak Flow --      Pain Score 01/03/23 1500 0     Pain Loc --      Pain Edu? --      Excl. in [place]? --    No data found.  Updated Vital Signs BP 139/74 (BP Location: Left Arm)   Pulse 83   Temp 98.3 F (36.8 C) (Oral)   Resp 18   LMP 12/10/2022   SpO2 98%      Physical Exam Vitals and nursing note reviewed.  Constitutional:      General: She is not in acute distress.    Appearance: Normal appearance. She is not ill-appearing.  HENT:     Head: Normocephalic and atraumatic.  Eyes:     Conjunctiva/sclera: Conjunctivae normal.  Cardiovascular:     Rate and Rhythm: Normal rate.  Pulmonary:     Effort: Pulmonary effort is normal. No respiratory distress.  Neurological:     Mental Status: She is alert.  Psychiatric:        Mood and Affect: Mood normal.        Behavior: Behavior normal.        Thought Content: Thought content normal.      UC Treatments / Results  Labs (all labs ordered are listed, but only abnormal results are  displayed) Labs Reviewed  POCT URINE PREGNANCY  CERVICOVAGINAL ANCILLARY ONLY    EKG   Radiology No results found.  Procedures Procedures (including critical care time)  Medications Ordered in UC Medications - No data to display  Initial Impression / Assessment and Plan / UC Course  I have reviewed the triage vital signs and the nursing notes.  Pertinent labs & imaging results that were available during my care of the patient were reviewed by me and considered in my medical decision making (see chart for details).    Pregnancy test negative. Cervical swab ordered for screening for STD as well as BV and yeast. Will await results for further recommendation and encouraged follow up with any further concerns. Patient reports she just had blood work for STD screening at [employer] office, declines same today.   Final Clinical Impressions(s) / UC Diagnoses   Final diagnoses:  Vaginal discharge  Pregnancy examination or test, negative result     Discharge Instructions       Pregnancy test negative. Recommend retesting in one week if you do not start your menstrual period.   Please follow up with any further concerns.      ED Prescriptions   None    PDMP not reviewed this encounter.   Francene Finders, PA-C 01/03/23 1523

## 2023-01-03 NOTE — ED Triage Notes (Signed)
Pt presents with recurrent  abnormal vaginal odor with a slight discharge X 2 days.

## 2023-01-03 NOTE — Discharge Instructions (Signed)
  Pregnancy test negative. Recommend retesting in one week if you do not start your menstrual period.   Please follow up with any further concerns.

## 2023-01-04 LAB — CERVICOVAGINAL ANCILLARY ONLY
Bacterial Vaginitis (gardnerella): POSITIVE — AB
Candida Glabrata: NEGATIVE
Candida Vaginitis: POSITIVE — AB
Chlamydia: NEGATIVE
Comment: NEGATIVE
Comment: NEGATIVE
Comment: NEGATIVE
Comment: NEGATIVE
Comment: NEGATIVE
Comment: NORMAL
Neisseria Gonorrhea: NEGATIVE
Trichomonas: NEGATIVE

## 2023-01-05 ENCOUNTER — Telehealth (HOSPITAL_COMMUNITY): Payer: Self-pay | Admitting: Emergency Medicine

## 2023-01-05 MED ORDER — FLUCONAZOLE 150 MG PO TABS: 150.0000 mg | ORAL_TABLET | Freq: Once | ORAL | 0 refills | Status: AC

## 2023-01-05 MED ORDER — METRONIDAZOLE 500 MG PO TABS: 500.0000 mg | ORAL_TABLET | Freq: Two times a day (BID) | ORAL | 0 refills | Status: DC

## 2023-01-25 ENCOUNTER — Ambulatory Visit
Admission: EM | Admit: 2023-01-25 | Discharge: 2023-01-25 | Disposition: A | Discharge status: Home / Self Care | Payer: Medicaid Other | Attending: Family Medicine | Admitting: Family Medicine

## 2023-01-25 DIAGNOSIS — N898 Other specified noninflammatory disorders of vagina: Secondary | ICD-10-CM | POA: Insufficient documentation

## 2023-01-25 MED ORDER — METRONIDAZOLE 500 MG PO TABS: 500.0000 mg | ORAL_TABLET | Freq: Two times a day (BID) | ORAL | 0 refills | Status: DC

## 2023-01-25 MED ORDER — FLUCONAZOLE 150 MG PO TABS: ORAL_TABLET | ORAL | 0 refills | Status: DC

## 2023-01-25 NOTE — ED Triage Notes (Signed)
Pt presents with abnormal vaginal discharge X 3 days.

## 2023-01-25 NOTE — ED Provider Notes (Signed)
[place]   [id number] 01/25/23 Arrival Time: [id number]  ASSESSMENT & PLAN:  1. Vaginal discharge    Meds ordered this encounter  Medications   metroNIDAZOLE (FLAGYL) 500 MG tablet    Sig: Take 1 tablet (500 mg total) by mouth 2 (two) times daily.    Dispense:  14 tablet    Refill:  0   fluconazole (DIFLUCAN) 150 MG tablet    Sig: Take one tablet by mouth as a single dose. May repeat in 3 days if symptoms persist.    Dispense:  2 tablet    Refill:  0      Discharge Instructions      We have sent testing for sexually transmitted infections. We will notify you of any positive results once they are received. If required, we will prescribe any medications you might need.  Please refrain from all sexual activity for at least the next seven days.     Without s/s of PID.  Labs Reviewed  CERVICOVAGINAL ANCILLARY ONLY    Will notify of any positive results. Instructed to refrain from sexual activity for at least seven days.  Reviewed expectations re: course of current medical issues. Questions answered. Outlined signs and symptoms indicating need for more acute intervention. Patient verbalized understanding. After Visit Summary given.   SUBJECTIVE:  Deborah Perry is a 22 y.o. female who presents with complaint of vaginal discharge. Onset abrupt. First noticed  3 d ago . H/O BV +/- yeast infection. "Feels like BV". No specific aggravating or alleviating factors reported. Denies: urinary frequency, dysuria, and gross hematuria. Afebrile. No abdominal or pelvic pain. Normal PO intake wihout n/v. No genital rashes or lesions.   Patient's last menstrual period was 01/08/2023.   OBJECTIVE:  Vitals:   01/25/23 1137  BP: (!) 113/54  Pulse: 60  Resp: 18  Temp: 98.4 F (36.9 C)  TempSrc: Oral  SpO2: 99%    General appearance: alert, cooperative, appears stated age and no distress Lungs: unlabored respirations; speaks full sentences without difficulty Back:  no CVA tenderness; FROM at waist Abdomen: soft, non-tender GU: deferred Skin: warm and dry Psychological: alert and cooperative; normal mood and affect.    Labs Reviewed  CERVICOVAGINAL ANCILLARY ONLY    No Known Allergies  History reviewed. No pertinent past medical history. Family History  Problem Relation Age of Onset   Hypertension Mother    Diabetes Mother    Kidney Stones Mother    Colon cancer Mother    Stroke Maternal Grandfather    Social History   Socioeconomic History   Marital status: Single    Spouse name: Not on file   Number of children: Not on file   Years of education: Not on file   Highest education level: Not on file  Occupational History   Not on file  Tobacco Use   Smoking status: Passive Smoke Exposure - Never Smoker   Smokeless tobacco: Never  Vaping Use   Vaping Use: Never used  Substance and Sexual Activity   Alcohol use: No   Drug use: No   Sexual activity: Yes    Partners: Male    Birth control/protection: None  Other Topics Concern   Not on file  Social History Narrative   Not on file   Social Determinants of Health   Financial Resource Strain: Not on file  Food Insecurity: Not on file  Transportation Needs: Not on file  Physical Activity: Not on file  Stress: Not on file  Social  Connections: Not on file  Intimate Partner Violence: Not on file           Vanessa Kick, MD 01/25/23 1300

## 2023-01-25 NOTE — Discharge Instructions (Signed)
We have sent testing for sexually transmitted infections. We will notify you of any positive results once they are received. If required, we will prescribe any medications you might need.  Please refrain from all sexual activity for at least the next seven days.  

## 2023-01-26 LAB — CERVICOVAGINAL ANCILLARY ONLY
Bacterial Vaginitis (gardnerella): NEGATIVE
Candida Glabrata: NEGATIVE
Candida Vaginitis: POSITIVE — AB
Chlamydia: NEGATIVE
Comment: NEGATIVE
Comment: NEGATIVE
Comment: NEGATIVE
Comment: NEGATIVE
Comment: NEGATIVE
Comment: NORMAL
Neisseria Gonorrhea: NEGATIVE
Trichomonas: NEGATIVE

## 2023-03-15 ENCOUNTER — Emergency Department (HOSPITAL_COMMUNITY): Payer: Medicaid Other

## 2023-03-15 ENCOUNTER — Other Ambulatory Visit: Payer: Self-pay

## 2023-03-15 ENCOUNTER — Ambulatory Visit
Admission: EM | Admit: 2023-03-15 | Discharge: 2023-03-15 | Disposition: A | Discharge status: Home / Self Care | Payer: Medicaid Other | Attending: Nurse Practitioner | Admitting: Nurse Practitioner

## 2023-03-15 ENCOUNTER — Emergency Department (HOSPITAL_COMMUNITY)
Admission: EM | Admit: 2023-03-15 | Discharge: 2023-03-15 | Disposition: A | Discharge status: Home / Self Care | Payer: Medicaid Other | Attending: Emergency Medicine | Admitting: Emergency Medicine

## 2023-03-15 ENCOUNTER — Ambulatory Visit: Payer: Medicaid Other

## 2023-03-15 DIAGNOSIS — S91332A Puncture wound without foreign body, left foot, initial encounter: Secondary | ICD-10-CM | POA: Diagnosis not present

## 2023-03-15 DIAGNOSIS — L02612 Cutaneous abscess of left foot: Secondary | ICD-10-CM | POA: Diagnosis not present

## 2023-03-15 DIAGNOSIS — M79672 Pain in left foot: Secondary | ICD-10-CM

## 2023-03-15 DIAGNOSIS — M7989 Other specified soft tissue disorders: Secondary | ICD-10-CM | POA: Diagnosis not present

## 2023-03-15 DIAGNOSIS — L0291 Cutaneous abscess, unspecified: Secondary | ICD-10-CM

## 2023-03-15 MED ORDER — DOXYCYCLINE HYCLATE 100 MG PO CAPS: 100.0000 mg | ORAL_CAPSULE | Freq: Two times a day (BID) | ORAL | 0 refills | Status: AC

## 2023-03-15 MED ORDER — IBUPROFEN 800 MG PO TABS: 800.0000 mg | ORAL_TABLET | Freq: Three times a day (TID) | ORAL | 0 refills | Status: DC

## 2023-03-15 MED ORDER — LIDOCAINE-EPINEPHRINE-TETRACAINE (LET) TOPICAL GEL
3.0000 mL | Freq: Once | TOPICAL | Status: AC
  Administered 2023-03-15: 3 mL via TOPICAL
  Filled 2023-03-15: qty 3

## 2023-03-15 MED ORDER — LIDOCAINE HCL 1 % IJ SOLN: 10.0000 mL | Freq: Once | INTRAMUSCULAR | Status: DC

## 2023-03-15 MED ORDER — NAPROXEN 500 MG PO TABS: 500.0000 mg | ORAL_TABLET | Freq: Two times a day (BID) | ORAL | 0 refills | Status: DC

## 2023-03-15 MED ORDER — IBUPROFEN 800 MG PO TABS
800.0000 mg | ORAL_TABLET | Freq: Once | ORAL | Status: AC
  Administered 2023-03-15: 800 mg via ORAL

## 2023-03-15 MED ORDER — ACETAMINOPHEN 325 MG PO TABS
650.0000 mg | ORAL_TABLET | Freq: Once | ORAL | Status: AC
  Administered 2023-03-15: 650 mg via ORAL
  Filled 2023-03-15: qty 2

## 2023-03-15 MED ORDER — LIDOCAINE HCL (PF) 1 % IJ SOLN
10.0000 mL | Freq: Once | INTRAMUSCULAR | Status: AC
  Administered 2023-03-15: 10 mL
  Filled 2023-03-15: qty 30
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
## 2023-03-15 NOTE — Discharge Instructions (Signed)
It was a pleasure taking care of you today.  As discussed, some pus came out of your left foot.  Take the antibiotic that was prescribed by urgent care.  Report to your scheduled foot doctor appointment tomorrow.  I am sending you home with pain medication.  Take as needed for pain.  Do not take medication if there are any concerns about pregnancy.  Return to the ER for new or worsening symptoms.
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
## 2023-03-15 NOTE — ED Triage Notes (Signed)
Pt c/o stepping on glass ~ 2 weeks ago. Here for wound check.

## 2023-03-15 NOTE — Discharge Instructions (Addendum)
You  Thank you you have been prescribed doxycycline 100 mg twice daily for 10 days for your puncture wound. Please complete full prescription You have also been prescribed ibuprofen 800 mg 1 tablet 3 times daily as needed for pain. Do encourage you to keep your foot elevated and use your crutches Follow-up with [facility] on tomorrow as scheduled

## 2023-03-16 ENCOUNTER — Ambulatory Visit (INDEPENDENT_AMBULATORY_CARE_PROVIDER_SITE_OTHER): Payer: Medicaid Other | Admitting: Podiatry

## 2023-03-16 DIAGNOSIS — Z91199 Patient's noncompliance with other medical treatment and regimen due to unspecified reason: Secondary | ICD-10-CM

## 2023-03-16 NOTE — Progress Notes (Signed)
Pt was a no show for apt, no charge 

## 2023-05-17 ENCOUNTER — Ambulatory Visit
Admission: EM | Admit: 2023-05-17 | Discharge: 2023-05-17 | Disposition: A | Discharge status: Home / Self Care | Payer: Medicaid Other | Attending: Emergency Medicine | Admitting: Emergency Medicine

## 2023-05-17 DIAGNOSIS — N898 Other specified noninflammatory disorders of vagina: Secondary | ICD-10-CM | POA: Insufficient documentation

## 2023-05-17 MED ORDER — FLUCONAZOLE 150 MG PO TABS: 150.0000 mg | ORAL_TABLET | Freq: Every day | ORAL | 0 refills | Status: AC

## 2023-05-17 MED ORDER — METRONIDAZOLE 500 MG PO TABS: 500.0000 mg | ORAL_TABLET | Freq: Two times a day (BID) | ORAL | 0 refills | Status: DC

## 2023-05-17 NOTE — Discharge Instructions (Addendum)
Today you are being treated prophylactically for  Bacterial vaginosis   Take Metronidazole 500 mg twice a day for 7 days, do not drink alcohol while using medication, this will make you feel sick   You may use use medication as needed 1 tablet if you begin this experience vaginal itching or irritation then after completing all antibiotic take second dose  Bacterial vaginosis which results from an overgrowth of one on several organisms that are normally present in your vagina. Vaginosis is an inflammation of the vagina that can result in discharge, itching and pain.  Labs pending 2-3 days, you will be contacted if positive for any sti and treatment will be sent to the pharmacy, you will have to return to the clinic if positive for gonorrhea to receive treatment   Please refrain from having sex until labs results, if positive please refrain from having sex until treatment complete and symptoms resolve   If positive for , Chlamydia  gonorrhea or trichomoniasis please notify partner or partners so they may tested as well  Moving forward, it is recommended you use some form of protection against the transmission of sti infections  such as condoms or dental dams with each sexual encounter     In addition: Avoid baths, hot tubs and whirlpool spas.  Don't use scented or harsh soaps Avoid irritants. These include scented tampons and pads. Wipe from front to back after using the toilet. Don't douche. Your vagina doesn't require cleansing other than normal bathing.  Use a condom.  Wear cotton underwear, this fabric absorbs some moisture.

## 2023-05-17 NOTE — ED Triage Notes (Signed)
Pt c/o vaginal d/c with odor x1wk. States hx of BV and took the last 5 flagyl pills she had left and help some.

## 2023-05-17 NOTE — ED Provider Notes (Signed)
[name]    CSN: [id number] Arrival date & time: 05/17/23  1709      History   Chief Complaint Chief Complaint  Patient presents with   Vaginal Discharge    HPI Deborah Perry is a 22 y.o. female.   Notes for evaluation of vaginal discharge with a foul odor present for 7 days.  Took an old prescription of metronidazole, 5 tablets total, symptoms did begin to improve until she had medication.  Denies sexual activity, last encounter 2 months ago, no known exposure.  Denies vaginal itching, irritation, abdominal pain, back pain, urinary symptoms.  History of reoccurring BV.  History reviewed. No pertinent past medical history.  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   Vaginal bleeding 12/12/2019   Chlamydia 12/12/2019   Rash 10/23/2017   Obesity 08/21/2015    Past Surgical History:  Procedure Laterality Date   NO PAST SURGERIES      OB History     Gravida  0   Para  0   Term  0   Preterm  0   AB  0   Living  0      SAB  0   IAB  0   Ectopic  0   Multiple  0   Live Births  0            Home Medications    Prior to Admission medications   Not on File    Family History Family History  Problem Relation Age of Onset   Hypertension Mother    Diabetes Mother    Kidney Stones Mother    Colon cancer Mother    Stroke Maternal Grandfather     Social History Social History   Tobacco Use   Smoking status: Never    Passive exposure: Yes   Smokeless tobacco: Never  Vaping Use   Vaping Use: Never used  Substance Use Topics   Alcohol use: No   Drug use: No     Allergies   Patient has no known allergies.   Review of Systems Review of Systems  Genitourinary:  Positive for vaginal discharge.     Physical Exam Triage Vital Signs ED Triage Vitals  Enc Vitals Group     BP 05/17/23 1719 121/82     Pulse Rate 05/17/23 1719 99     Resp 05/17/23 1719 18     Temp 05/17/23  1719 97.9 F (36.6 C)     Temp Source 05/17/23 1719 Oral     SpO2 05/17/23 1719 99 %     Weight --      Height --      Head Circumference --      Peak Flow --      Pain Score 05/17/23 1720 0     Pain Loc --      Pain Edu? --      Excl. in GC? --    No data found.  Updated Vital Signs BP 121/82 (BP Location: Left Arm)   Pulse 99   Temp 97.9 F (36.6 C) (Oral)   Resp 18   LMP 05/04/2023   SpO2 99%   Visual Acuity Right Eye Distance:   Left Eye Distance:   Bilateral Distance:    Right Eye Near:   Left Eye Near:    Bilateral Near:     Physical Exam Constitutional:      Appearance: Normal appearance.  Eyes:  Extraocular Movements: Extraocular movements intact.  Pulmonary:     Effort: Pulmonary effort is normal.  Genitourinary:    Comments: deferred Neurological:     Mental Status: She is alert and oriented to person, place, and time. Mental status is at baseline.      UC Treatments / Results  Labs (all labs ordered are listed, but only abnormal results are displayed) Labs Reviewed  CERVICOVAGINAL ANCILLARY ONLY    EKG   Radiology No results found.  Procedures Procedures (including critical care time)  Medications Ordered in UC Medications - No data to display  Initial Impression / Assessment and Plan / UC Course  I have reviewed the triage vital signs and the nursing notes.  Pertinent labs & imaging results that were available during my care of the patient were reviewed by me and considered in my medical decision making (see chart for details).  Vaginal discharge  Treated prophylactically for BV, metronidazole sent to pharmacy, bouts of standing from alcohol during use, Diflucan sent prophylactically as she endorses yeast infections after antibiotic courses, STI labs are pending, will treat further per call, advised abstinence until lab results, treatment is complete and all symptoms have resolved Final Clinical Impressions(s) / UC Diagnoses    Final diagnoses:  None   Discharge Instructions   None    ED Prescriptions   None    PDMP not reviewed this encounter.   Valinda Hoar, NP 05/17/23 1731

## 2023-05-19 LAB — CERVICOVAGINAL ANCILLARY ONLY
Bacterial Vaginitis (gardnerella): POSITIVE — AB
Candida Glabrata: NEGATIVE
Candida Vaginitis: POSITIVE — AB
Chlamydia: NEGATIVE
Comment: NEGATIVE
Comment: NEGATIVE
Comment: NEGATIVE
Comment: NEGATIVE
Comment: NEGATIVE
Comment: NORMAL
Neisseria Gonorrhea: NEGATIVE
Trichomonas: NEGATIVE

## 2023-08-19 ENCOUNTER — Telehealth: Payer: Medicaid Other | Admitting: Nurse Practitioner

## 2023-08-19 DIAGNOSIS — N760 Acute vaginitis: Secondary | ICD-10-CM

## 2023-08-20 MED ORDER — METRONIDAZOLE 500 MG PO TABS: 500.0000 mg | ORAL_TABLET | Freq: Two times a day (BID) | ORAL | 0 refills | Status: DC

## 2023-08-20 MED ORDER — FLUCONAZOLE 150 MG PO TABS: 150.0000 mg | ORAL_TABLET | Freq: Every day | ORAL | 0 refills | Status: AC

## 2023-08-20 NOTE — Progress Notes (Signed)
If you are no sexually active I recommend you follow back up with the gynecologist you saw in December to discuss suppressive therapy. I have sent the metronidazole and diflucan as requested. Also just FYI yeast does not turn into BV if not treated as yeast is a fungus and BV is a bacterial infection. They are 2 separate conditions not related to one another.   E-Visit for Vaginal Symptoms  We are sorry that you are not feeling well. Here is how we plan to help! Based on what you shared with me it looks like you: May have a yeast vaginosis  Vaginosis is an inflammation of the vagina that can result in discharge, itching and pain. The cause is usually a change in the normal balance of vaginal bacteria or an infection. Vaginosis can also result from reduced estrogen levels after menopause.  The most common causes of vaginosis are:   Bacterial vaginosis which results from an overgrowth of one on several organisms that are normally present in your vagina.   Yeast infections which are caused by a naturally occurring fungus called candida.   Vaginal atrophy (atrophic vaginosis) which results from the thinning of the vagina from reduced estrogen levels after menopause.   Trichomoniasis which is caused by a parasite and is commonly transmitted by sexual intercourse.  Factors that increase your risk of developing vaginosis include: Medications, such as antibiotics and steroids Uncontrolled diabetes Use of hygiene products such as bubble bath, vaginal spray or vaginal deodorant Douching Wearing damp or tight-fitting clothing Using an intrauterine device (IUD) for birth control Hormonal changes, such as those associated with pregnancy, birth control pills or menopause Sexual activity Having a sexually transmitted infection  Your treatment plan is Metronidazole or Flagyl 500mg  twice a day for 7 days as requested and diflucan one tablet for the yeast infection.  I have electronically sent this  prescription into the pharmacy that you have chosen.  Be sure to take all of the medication as directed. Stop taking any medication if you develop a rash, tongue swelling or shortness of breath. Mothers who are breast feeding should consider pumping and discarding their breast milk while on these antibiotics. However, there is no consensus that infant exposure at these doses would be harmful.  Remember that medication creams can weaken latex condoms. Marland Kitchen   HOME CARE:  Good hygiene may prevent some types of vaginosis from recurring and may relieve some symptoms:  Avoid baths, hot tubs and whirlpool spas. Rinse soap from your outer genital area after a shower, and dry the area well to prevent irritation. Don't use scented or harsh soaps, such as those with deodorant or antibacterial action. Avoid irritants. These include scented tampons and pads. Wipe from front to back after using the toilet. Doing so avoids spreading fecal bacteria to your vagina.  Other things that may help prevent vaginosis include:  Don't douche. Your vagina doesn't require cleansing other than normal bathing. Repetitive douching disrupts the normal organisms that reside in the vagina and can actually increase your risk of vaginal infection. Douching won't clear up a vaginal infection. Use a latex condom. Both female and female latex condoms may help you avoid infections spread by sexual contact. Wear cotton underwear. Also wear pantyhose with a cotton crotch. If you feel comfortable without it, skip wearing underwear to bed. Yeast thrives in [employer] Your symptoms should improve in the next day or two.  GET HELP RIGHT AWAY IF:  You have pain in your lower abdomen (  pelvic area or over your ovaries) You develop nausea or vomiting You develop a fever Your discharge changes or worsens You have persistent pain with intercourse You develop shortness of breath, a rapid pulse, or you faint.  These symptoms could be  signs of problems or infections that need to be evaluated by a medical provider now.  MAKE SURE YOU   Understand these instructions. Will watch your condition. Will get help right away if you are not doing well or get worse.  Thank you for choosing an e-visit.  Your e-visit answers were reviewed by a board certified advanced clinical practitioner to complete your personal care plan. Depending upon the condition, your plan could have included both over the counter or prescription medications.  Please review your pharmacy choice. Make sure the pharmacy is open so you can pick up prescription now. If there is a problem, you may contact your provider through [employer] and have the prescription routed to another pharmacy.  Your safety is important to [place]. If you have drug allergies check your prescription carefully.   For the next 24 hours you can use MyChart to ask questions about today's visit, request a non-urgent call back, or ask for a work or school excuse. You will get an email in the next two days asking about your experience. I hope that your e-visit has been valuable and will speed your recovery.

## 2023-08-29 NOTE — Progress Notes (Signed)
I have spent 5 minutes in review of e-visit questionnaire, review and updating patient chart, medical decision making and response to patient.  ° °Jerrell Mangel W Secilia Apps, NP ° °  °

## 2023-11-09 ENCOUNTER — Telehealth: Payer: Medicaid Other | Admitting: Family Medicine

## 2023-11-09 DIAGNOSIS — B3731 Acute candidiasis of vulva and vagina: Secondary | ICD-10-CM | POA: Diagnosis not present

## 2023-11-10 MED ORDER — FLUCONAZOLE 150 MG PO TABS: 150.0000 mg | ORAL_TABLET | ORAL | 0 refills | Status: DC | PRN

## 2023-11-10 NOTE — Progress Notes (Signed)

## 2023-12-11 ENCOUNTER — Ambulatory Visit
Admission: EM | Admit: 2023-12-11 | Discharge: 2023-12-11 | Disposition: A | Discharge status: Home / Self Care | Payer: Medicaid Other | Attending: Family Medicine | Admitting: Family Medicine

## 2023-12-11 DIAGNOSIS — N898 Other specified noninflammatory disorders of vagina: Secondary | ICD-10-CM | POA: Diagnosis not present

## 2023-12-11 LAB — POCT URINALYSIS DIP (MANUAL ENTRY)
Bilirubin, UA: NEGATIVE
Glucose, UA: NEGATIVE mg/dL
Ketones, POC UA: NEGATIVE mg/dL
Nitrite, UA: NEGATIVE
Protein Ur, POC: 30 mg/dL — AB
Spec Grav, UA: >=1.03 — AB (ref 1.010–1.025)
Urobilinogen, UA: 0.2 U/dL
pH, UA: 5.5 (ref 5.0–8.0)

## 2023-12-11 LAB — POCT URINE PREGNANCY: Preg Test, Ur: NEGATIVE

## 2023-12-11 MED ORDER — FLUCONAZOLE 150 MG PO TABS: 150.0000 mg | ORAL_TABLET | ORAL | 0 refills | Status: DC

## 2023-12-11 NOTE — ED Triage Notes (Signed)
 Pt presents with vaginal discharge and itchiness x 1 day. Pt currently denies pain. Pt reports she did have unusual vaginal smell on 12/26, took a leftover Metronidazole , and symptoms resolved. Pt believes she has yeast based on her symptoms and would like to be tested.

## 2023-12-11 NOTE — Discharge Instructions (Signed)
 Test results will be released to your MyChart account We will contact you if anything is positive and requires treatment.

## 2023-12-11 NOTE — ED Provider Notes (Signed)
 [facility]    CSN: [id number] Arrival date & time: 12/11/23  1910      History   Chief Complaint No chief complaint on file.   HPI Deborah Perry is a 23 y.o. female.   HPI Suspected yeast infection symptom onset several days ago symptoms include thick white discharge with some itching.  Admits taking an antibiotic recently for BV.  Admits sexual encounter.  Denies abdominal pain, fever, chills, sweats, nausea, vomiting, dysuria, frequency, urgency  No past medical history on file.  Patient Active Problem List   Diagnosis Date Noted   Trichomoniasis 12/12/2019   Vaginal bleeding 12/12/2019   Chlamydia 12/12/2019   Rash 10/23/2017   Obesity 08/21/2015    Past Surgical History:  Procedure Laterality Date   NO PAST SURGERIES      OB History     Gravida  0   Para  0   Term  0   Preterm  0   AB  0   Living  0      SAB  0   IAB  0   Ectopic  0   Multiple  0   Live Births  0            Home Medications    Prior to Admission medications   Medication Sig Start Date End Date Taking? Authorizing Provider  fluconazole  (DIFLUCAN ) 150 MG tablet Take 1 tablet (150 mg total) by mouth every 3 (three) days as needed. 11/10/23   Vivienne Delon HERO, PA-C  metroNIDAZOLE  (FLAGYL ) 500 MG tablet Take 1 tablet (500 mg total) by mouth 2 (two) times daily. 08/20/23   Theotis Haze ORN, NP    Family History Family History  Problem Relation Age of Onset   Hypertension Mother    Diabetes Mother    Kidney Stones Mother    Colon cancer Mother    Stroke Maternal Grandfather     Social History Social History   Tobacco Use   Smoking status: Never    Passive exposure: Yes   Smokeless tobacco: Never  Vaping Use   Vaping status: Never Used  Substance Use Topics   Alcohol use: No   Drug use: No     Allergies   Patient has no known allergies.   Review of Systems Review of Systems  Genitourinary:  Positive for vaginal discharge. Negative  for difficulty urinating, dysuria, frequency and vaginal bleeding.     Physical Exam Triage Vital Signs ED Triage Vitals [12/11/23 1914]  Encounter Vitals Group     BP 117/74     Systolic BP Percentile      Diastolic BP Percentile      Pulse Rate 61     Resp 16     Temp 98.1 F (36.7 C)     Temp Source Oral     SpO2 99 %     Weight 157 lb (71.2 kg)     Height      Head Circumference      Peak Flow      Pain Score      Pain Loc      Pain Education      Exclude from Growth Chart    No data found.  Updated Vital Signs BP 117/74 (BP Location: Right Arm)   Pulse 61   Temp 98.1 F (36.7 C) (Oral)   Resp 16   Wt 157 lb (71.2 kg)   SpO2 99%   BMI 26.13 kg/m   Visual  Acuity Right Eye Distance:   Left Eye Distance:   Bilateral Distance:    Right Eye Near:   Left Eye Near:    Bilateral Near:     Physical Exam Vitals and nursing note reviewed.  Constitutional:      Appearance: She is not ill-appearing.  HENT:     Head: Normocephalic and atraumatic.  Cardiovascular:     Rate and Rhythm: Normal rate and regular rhythm.     Heart sounds: Normal heart sounds.  Pulmonary:     Effort: Pulmonary effort is normal.     Breath sounds: Normal breath sounds.  Abdominal:     Palpations: Abdomen is soft.     Tenderness: There is no abdominal tenderness. There is no right CVA tenderness, left CVA tenderness or guarding.  Skin:    General: Skin is warm and dry.  Neurological:     Mental Status: She is alert.      UC Treatments / Results  Labs (all labs ordered are listed, but only abnormal results are displayed) Labs Reviewed  POCT URINALYSIS DIP (MANUAL ENTRY) - Abnormal; Notable for the following components:      Result Value   Clarity, UA hazy (*)    Spec Grav, UA >=1.030 (*)    Blood, UA trace-intact (*)    Protein Ur, POC =30 (*)    Leukocytes, UA Trace (*)    All other components within normal limits  POCT URINE PREGNANCY  CERVICOVAGINAL ANCILLARY ONLY     EKG   Radiology No results found.  Procedures Procedures (including critical care time)  Medications Ordered in UC Medications - No data to display  Initial Impression / Assessment and Plan / UC Course  I have reviewed the triage vital signs and the nursing notes.  Pertinent labs & imaging results that were available during my care of the patient were reviewed by me and considered in my medical decision making (see chart for details).     23 year old female with vaginal discharge suspect she has a yeast infection.  Has history of BV takes the biotics as needed. Point-of-care urinalysis is cloudy with trace blood otherwise normal point-of-care pregnancy is negative.  Will treat for yeast pending cytology swab results Final Clinical Impressions(s) / UC Diagnoses   Final diagnoses:  Vaginal discharge   Discharge Instructions   None    ED Prescriptions   None    PDMP not reviewed this encounter.   Con Arganbright, [place] 12/11/23 1940

## 2023-12-12 LAB — CERVICOVAGINAL ANCILLARY ONLY
Bacterial Vaginitis (gardnerella): NEGATIVE
Candida Glabrata: NEGATIVE
Candida Vaginitis: POSITIVE — AB
Chlamydia: NEGATIVE
Comment: NEGATIVE
Comment: NEGATIVE
Comment: NEGATIVE
Comment: NEGATIVE
Comment: NEGATIVE
Comment: NORMAL
Neisseria Gonorrhea: NEGATIVE
Trichomonas: NEGATIVE

## 2023-12-25 ENCOUNTER — Telehealth: Payer: Medicaid Other | Admitting: Physician Assistant

## 2023-12-25 DIAGNOSIS — B3731 Acute candidiasis of vulva and vagina: Secondary | ICD-10-CM | POA: Diagnosis not present

## 2023-12-25 MED ORDER — FLUCONAZOLE 150 MG PO TABS: 150.0000 mg | ORAL_TABLET | Freq: Once | ORAL | 0 refills | Status: AC

## 2023-12-25 NOTE — Progress Notes (Signed)

## 2024-01-01 ENCOUNTER — Ambulatory Visit
Admission: EM | Admit: 2024-01-01 | Discharge: 2024-01-01 | Disposition: A | Discharge status: Home / Self Care | Payer: Medicaid Other | Attending: Family Medicine | Admitting: Family Medicine

## 2024-01-01 DIAGNOSIS — N760 Acute vaginitis: Secondary | ICD-10-CM | POA: Diagnosis not present

## 2024-01-01 DIAGNOSIS — B9689 Other specified bacterial agents as the cause of diseases classified elsewhere: Secondary | ICD-10-CM | POA: Diagnosis not present

## 2024-01-01 MED ORDER — FLUCONAZOLE 150 MG PO TABS: 150.0000 mg | ORAL_TABLET | ORAL | 0 refills | Status: DC

## 2024-01-01 MED ORDER — METRONIDAZOLE 500 MG PO TABS: 500.0000 mg | ORAL_TABLET | Freq: Two times a day (BID) | ORAL | 0 refills | Status: DC

## 2024-01-01 NOTE — ED Provider Notes (Signed)
[facility]  Note:  This document was prepared using Conservation officer, historic buildings and may include unintentional dictation errors.  MRN: [id number] DOB: [birth year]  Subjective:   Deborah Perry is a 23 y.o. female presenting for 2-day history of acute onset white vaginal discharge.  Has a history of recurrent BV and yeast infections.  She was treated for yeast infection earlier this month.  No urinary symptoms.  Would like to be covered for recurrent BV and yeast infection.  Would also like to confirm she does not have an STI.  No current facility-administered medications for this encounter.  Current Outpatient Medications:    metroNIDAZOLE (FLAGYL) 500 MG tablet, Take 1 tablet (500 mg total) by mouth 2 (two) times daily., Disp: 14 tablet, Rfl: 0   No Known Allergies  History reviewed. No pertinent past medical history.   Past Surgical History:  Procedure Laterality Date   NO PAST SURGERIES      Family History  Problem Relation Age of Onset   Hypertension Mother    Diabetes Mother    Kidney Stones Mother    Colon cancer Mother    Stroke Maternal Grandfather     Social History   Tobacco Use   Smoking status: Never    Passive exposure: Yes   Smokeless tobacco: Never  Vaping Use   Vaping status: Never Used  Substance Use Topics   Alcohol use: No   Drug use: No    ROS   Objective:   Vitals: BP (!) 154/67 (BP Location: Left Arm)   Pulse (!) 59   Temp 99 F (37.2 C) (Oral)   Resp 16   LMP  (Within Weeks) Comment: 1 week  SpO2 97%   Physical Exam Constitutional:      General: She is not in acute distress.    Appearance: Normal appearance. She is well-developed. She is not ill-appearing, toxic-appearing or diaphoretic.  HENT:     Head: Normocephalic and atraumatic.     Nose: Nose normal.     Mouth/Throat:     Mouth: Mucous membranes are moist.     Pharynx: Oropharynx is clear.  Eyes:     General: No scleral icterus.        Right eye: No discharge.        Left eye: No discharge.     Extraocular Movements: Extraocular movements intact.     Conjunctiva/sclera: Conjunctivae normal.  Cardiovascular:     Rate and Rhythm: Normal rate.  Pulmonary:     Effort: Pulmonary effort is normal.  Abdominal:     General: Bowel sounds are normal. There is no distension.     Palpations: Abdomen is soft. There is no mass.     Tenderness: There is no abdominal tenderness. There is no right CVA tenderness, left CVA tenderness, guarding or rebound.  Skin:    General: Skin is warm and dry.  Neurological:     General: No focal deficit present.     Mental Status: She is alert and oriented to person, place, and time.  Psychiatric:        Mood and Affect: Mood normal.        Behavior: Behavior normal.        Thought Content: Thought content normal.        Judgment: Judgment normal.    Patient declines urine pregnancy test.  Assessment and Plan :   PDMP not reviewed this encounter.  1. Acute vaginitis   2. Bacterial  vaginosis     We will treat patient empirically for bacterial vaginosis with Flagyl and for yeast vaginitis with fluconazole.  Will use an extended course of fluconazole given frequent yeast infections.  Labs pending. Counseled patient on potential for adverse effects with medications prescribed/recommended today, ER and return-to-clinic precautions discussed, patient verbalized understanding.    Wallis Bamberg, [place] 01/01/24 4132

## 2024-01-01 NOTE — ED Triage Notes (Signed)
Pt requested STD's test. Pt think she has a yeast infection as she is having white vagina discharge x 2 days.

## 2024-01-02 LAB — CERVICOVAGINAL ANCILLARY ONLY
Bacterial Vaginitis (gardnerella): POSITIVE — AB
Candida Glabrata: NEGATIVE
Candida Vaginitis: POSITIVE — AB
Chlamydia: NEGATIVE
Comment: NEGATIVE
Comment: NEGATIVE
Comment: NEGATIVE
Comment: NEGATIVE
Comment: NEGATIVE
Comment: NORMAL
Neisseria Gonorrhea: NEGATIVE
Trichomonas: NEGATIVE

## 2024-01-22 ENCOUNTER — Other Ambulatory Visit: Payer: Self-pay

## 2024-01-22 ENCOUNTER — Ambulatory Visit
Admission: EM | Admit: 2024-01-22 | Discharge: 2024-01-22 | Disposition: A | Discharge status: Home / Self Care | Payer: Medicaid Other | Attending: Family Medicine | Admitting: Family Medicine

## 2024-01-22 DIAGNOSIS — Z113 Encounter for screening for infections with a predominantly sexual mode of transmission: Secondary | ICD-10-CM

## 2024-01-22 NOTE — ED Provider Notes (Signed)
[facility]    CSN: [id number] Arrival date & time: 01/22/24  1656      History   Chief Complaint Chief Complaint  Patient presents with   SEXUALLY TRANSMITTED DISEASE    HPI Deborah Perry is a 23 y.o. female.   The history is provided by the patient.  Had unprotected sex and would like to be checked for STIs.  Denies vaginal discharge, itching or burning, dysuria, frequency, urgency, hematuria, genital rash, abdominal pain, nausea, vomiting.  LMP 01/16/2024  History reviewed. No pertinent past medical history.  Patient Active Problem List   Diagnosis Date Noted   Trichomoniasis 12/12/2019   Vaginal bleeding 12/12/2019   Chlamydia 12/12/2019   Rash 10/23/2017   Obesity 08/21/2015    Past Surgical History:  Procedure Laterality Date   NO PAST SURGERIES      OB History     Gravida  0   Para  0   Term  0   Preterm  0   AB  0   Living  0      SAB  0   IAB  0   Ectopic  0   Multiple  0   Live Births  0            Home Medications    Prior to Admission medications   Medication Sig Start Date End Date Taking? Authorizing Provider  fluconazole (DIFLUCAN) 150 MG tablet Take 1 tablet (150 mg total) by mouth every 3 (three) days. 01/01/24   Wallis Bamberg, PA-C  metroNIDAZOLE (FLAGYL) 500 MG tablet Take 1 tablet (500 mg total) by mouth 2 (two) times daily with a meal. DO NOT CONSUME ALCOHOL WHILE TAKING THIS MEDICATION. 01/01/24   Wallis Bamberg, PA-C    Family History Family History  Problem Relation Age of Onset   Hypertension Mother    Diabetes Mother    Kidney Stones Mother    Colon cancer Mother    Stroke Maternal Grandfather     Social History Social History   Tobacco Use   Smoking status: Never    Passive exposure: Yes   Smokeless tobacco: Never  Vaping Use   Vaping status: Never Used  Substance Use Topics   Alcohol use: No   Drug use: No     Allergies   Patient has no known allergies.   Review of  Systems Review of Systems   Physical Exam Triage Vital Signs ED Triage Vitals  Encounter Vitals Group     BP 01/22/24 1715 107/66     Systolic BP Percentile --      Diastolic BP Percentile --      Pulse Rate 01/22/24 1715 66     Resp 01/22/24 1715 18     Temp 01/22/24 1715 97.9 F (36.6 C)     Temp Source 01/22/24 1715 Oral     SpO2 01/22/24 1715 99 %     Weight 01/22/24 1714 163 lb (73.9 kg)     Height 01/22/24 1714 5\' 5"  (1.651 m)     Head Circumference --      Peak Flow --      Pain Score 01/22/24 1714 0     Pain Loc --      Pain Education --      Exclude from Growth Chart --    No data found.  Updated Vital Signs BP 107/66 (BP Location: Right Arm)   Pulse 66   Temp 97.9 F (36.6 C) (Oral)  Resp 18   Ht 5\' 5"  (1.651 m)   Wt 163 lb (73.9 kg)   LMP 01/08/2024 (Exact Date)   SpO2 99%   BMI 27.12 kg/m   Visual Acuity Right Eye Distance:   Left Eye Distance:   Bilateral Distance:    Right Eye Near:   Left Eye Near:    Bilateral Near:     Physical Exam Vitals and nursing note reviewed.  Constitutional:      Appearance: Normal appearance. She is ill-appearing.  Cardiovascular:     Rate and Rhythm: Normal rate.     Heart sounds: Normal heart sounds.  Pulmonary:     Breath sounds: Normal breath sounds.  Abdominal:     General: Bowel sounds are normal.     Palpations: Abdomen is soft.     Tenderness: There is no abdominal tenderness.  Neurological:     Mental Status: She is alert and oriented to person, place, and time.  Psychiatric:        Mood and Affect: Mood normal.      UC Treatments / Results  Labs (all labs ordered are listed, but only abnormal results are displayed) Labs Reviewed  RPR  HIV ANTIBODY (ROUTINE TESTING W REFLEX)  CERVICOVAGINAL ANCILLARY ONLY    EKG   Radiology No results found.  Procedures Procedures (including critical care time)  Medications Ordered in UC Medications - No data to display  Initial Impression  / Assessment and Plan / UC Course  I have reviewed the triage vital signs and the nursing notes.  Pertinent labs & imaging results that were available during my care of the patient were reviewed by me and considered in my medical decision making (see chart for details).     23 year old female requesting STI testing no symptoms  Final Clinical Impressions(s) / UC Diagnoses   Final diagnoses:  Screening examination for STI   Discharge Instructions   None    ED Prescriptions   None    PDMP not reviewed this encounter.   Meliton Rattan, [place] 01/22/24 1747   Received call from patient today 01/23/2024 Rating her STI testing.  She was positive for chlamydia and Candida, Rx doxycycline and Diflucan sent to her pharmacy.  I spoke with patient reviewed her results and discussed the treatment.   Meliton Rattan, [place] 01/23/24 1527

## 2024-01-22 NOTE — ED Triage Notes (Addendum)
Pt presents to urgent care to be tested for STD's. Pt currently denies symptoms. States she had unprotected sex recently. Pt is requesting just the swab today.   After triage, pt reports to Angie PA she would like blood work to be completed as well.

## 2024-01-22 NOTE — Discharge Instructions (Addendum)
 Test results will be released to your MyChart account We will contact you if anything is positive and requires treatment.

## 2024-01-23 ENCOUNTER — Encounter: Payer: Self-pay | Admitting: Physician Assistant

## 2024-01-23 LAB — HIV ANTIBODY (ROUTINE TESTING W REFLEX): HIV Screen 4th Generation wRfx: NONREACTIVE

## 2024-01-23 LAB — CERVICOVAGINAL ANCILLARY ONLY
Bacterial Vaginitis (gardnerella): NEGATIVE
Candida Glabrata: NEGATIVE
Candida Vaginitis: POSITIVE — AB
Chlamydia: POSITIVE — AB
Comment: NEGATIVE
Comment: NEGATIVE
Comment: NEGATIVE
Comment: NEGATIVE
Comment: NEGATIVE
Comment: NORMAL
Neisseria Gonorrhea: NEGATIVE
Trichomonas: NEGATIVE

## 2024-01-23 LAB — RPR: RPR Ser Ql: NONREACTIVE

## 2024-01-23 MED ORDER — FLUCONAZOLE 150 MG PO TABS: 150.0000 mg | ORAL_TABLET | Freq: Every day | ORAL | 0 refills | Status: AC

## 2024-01-23 MED ORDER — DOXYCYCLINE HYCLATE 100 MG PO CAPS: 100.0000 mg | ORAL_CAPSULE | Freq: Two times a day (BID) | ORAL | 0 refills | Status: DC

## 2024-02-24 ENCOUNTER — Ambulatory Visit
Admission: EM | Admit: 2024-02-24 | Discharge: 2024-02-24 | Disposition: A | Discharge status: Home / Self Care | Attending: Internal Medicine | Admitting: Internal Medicine

## 2024-02-24 ENCOUNTER — Other Ambulatory Visit: Payer: Self-pay

## 2024-02-24 DIAGNOSIS — Z113 Encounter for screening for infections with a predominantly sexual mode of transmission: Secondary | ICD-10-CM | POA: Diagnosis not present

## 2024-02-24 NOTE — ED Provider Notes (Signed)
 [name]    CSN: [id number] Arrival date & time: 02/24/24  1215      History   Chief Complaint Chief Complaint  Patient presents with   STD Testing    HPI Essense Bousquet is a 23 y.o. female.   Patient presents for STD testing.  Reports that she was seen on 2/17 and tested positive for chlamydia.  She wishes to be retested today to ensure infection is clear.  Denies any current symptoms or any new exposures.  Last menstrual cycle was at the beginning of March.     No past medical history on file.  Patient Active Problem List   Diagnosis Date Noted   Trichomoniasis 12/12/2019   Vaginal bleeding 12/12/2019   Chlamydia 12/12/2019   Rash 10/23/2017   Obesity 08/21/2015    Past Surgical History:  Procedure Laterality Date   NO PAST SURGERIES      OB History     Gravida  0   Para  0   Term  0   Preterm  0   AB  0   Living  0      SAB  0   IAB  0   Ectopic  0   Multiple  0   Live Births  0            Home Medications    Prior to Admission medications   Medication Sig Start Date End Date Taking? Authorizing Provider  doxycycline (VIBRAMYCIN) 100 MG capsule Take 1 capsule (100 mg total) by mouth 2 (two) times daily. 01/23/24   Meliton Rattan, PA    Family History Family History  Problem Relation Age of Onset   Hypertension Mother    Diabetes Mother    Kidney Stones Mother    Colon cancer Mother    Stroke Maternal Grandfather     Social History Social History   Tobacco Use   Smoking status: Never    Passive exposure: Yes   Smokeless tobacco: Never  Vaping Use   Vaping status: Never Used  Substance Use Topics   Alcohol use: No   Drug use: No     Allergies   Patient has no known allergies.   Review of Systems Review of Systems Per HPI  Physical Exam Triage Vital Signs ED Triage Vitals  Encounter Vitals Group     BP 02/24/24 1232 106/68     Systolic BP Percentile --      Diastolic BP Percentile  --      Pulse Rate 02/24/24 1232 61     Resp 02/24/24 1232 16     Temp 02/24/24 1232 98.2 F (36.8 C)     Temp Source 02/24/24 1232 Oral     SpO2 02/24/24 1232 97 %     Weight --      Height --      Head Circumference --      Peak Flow --      Pain Score 02/24/24 1231 0     Pain Loc --      Pain Education --      Exclude from Growth Chart --    No data found.  Updated Vital Signs BP 106/68 (BP Location: Right Arm)   Pulse 61   Temp 98.2 F (36.8 C) (Oral)   Resp 16   LMP 02/05/2024 (Exact Date)   SpO2 97%   Visual Acuity Right Eye Distance:   Left Eye Distance:   Bilateral Distance:  Right Eye Near:   Left Eye Near:    Bilateral Near:     Physical Exam Constitutional:      General: She is not in acute distress.    Appearance: Normal appearance. She is not toxic-appearing or diaphoretic.  HENT:     Head: Normocephalic and atraumatic.  Eyes:     Extraocular Movements: Extraocular movements intact.     Conjunctiva/sclera: Conjunctivae normal.  Pulmonary:     Effort: Pulmonary effort is normal.  Genitourinary:    Comments: Deferred with shared decision making.  Self swab performed. Neurological:     General: No focal deficit present.     Mental Status: She is alert and oriented to person, place, and time. Mental status is at baseline.  Psychiatric:        Mood and Affect: Mood normal.        Behavior: Behavior normal.        Thought Content: Thought content normal.        Judgment: Judgment normal.      UC Treatments / Results  Labs (all labs ordered are listed, but only abnormal results are displayed) Labs Reviewed  CERVICOVAGINAL ANCILLARY ONLY    EKG   Radiology No results found.  Procedures Procedures (including critical care time)  Medications Ordered in UC Medications - No data to display  Initial Impression / Assessment and Plan / UC Course  I have reviewed the triage vital signs and the nursing notes.  Pertinent labs & imaging  results that were available during my care of the patient were reviewed by me and considered in my medical decision making (see chart for details).     Cervicovaginal swab pending.  Awaiting results.  Advised strict follow-up precautions.  Patient verbalized understanding and was agreeable with plan. Final Clinical Impressions(s) / UC Diagnoses   Final diagnoses:  Screening examination for venereal disease   Discharge Instructions   None    ED Prescriptions   None    PDMP not reviewed this encounter.   Gustavus Bryant, [place] 02/24/24 1254

## 2024-02-24 NOTE — ED Triage Notes (Signed)
 Pt is here to be re-tested. Pt tested pos for Chlamydia about a month ago. She has finished the treatment plan and now wants to be tested again to be sure the STI is gone.

## 2024-02-26 LAB — CERVICOVAGINAL ANCILLARY ONLY
Chlamydia: NEGATIVE
Comment: NEGATIVE
Comment: NEGATIVE
Comment: NORMAL
Neisseria Gonorrhea: NEGATIVE
Trichomonas: NEGATIVE

## 2024-03-07 ENCOUNTER — Emergency Department (HOSPITAL_COMMUNITY)
Admission: EM | Admit: 2024-03-07 | Discharge: 2024-03-08 | Disposition: A | Discharge status: Home / Self Care | Attending: Emergency Medicine | Admitting: Emergency Medicine

## 2024-03-07 ENCOUNTER — Encounter (HOSPITAL_COMMUNITY): Payer: Self-pay

## 2024-03-07 DIAGNOSIS — M79641 Pain in right hand: Secondary | ICD-10-CM | POA: Diagnosis present

## 2024-03-07 DIAGNOSIS — L03011 Cellulitis of right finger: Secondary | ICD-10-CM | POA: Diagnosis not present

## 2024-03-07 DIAGNOSIS — Z23 Encounter for immunization: Secondary | ICD-10-CM | POA: Insufficient documentation

## 2024-03-07 MED ORDER — TETANUS-DIPHTH-ACELL PERTUSSIS 5-2.5-18.5 LF-MCG/0.5 IM SUSY
0.5000 mL | PREFILLED_SYRINGE | Freq: Once | INTRAMUSCULAR | Status: AC
  Administered 2024-03-08: 0.5 mL via INTRAMUSCULAR
  Filled 2024-03-07: qty 0.5

## 2024-03-07 MED ORDER — LIDOCAINE HCL 2 % IJ SOLN
10.0000 mL | Freq: Once | INTRAMUSCULAR | Status: AC
  Administered 2024-03-08: 200 mg
  Filled 2024-03-07: qty 20

## 2024-03-07 NOTE — ED Triage Notes (Signed)
 Pt has swelling to R middle finger around the nail bed, pt states it drained a little bit yesterday.

## 2024-03-07 NOTE — ED Provider Notes (Incomplete)
  [place] EMERGENCY DEPARTMENT AT [facility] Provider Note   CSN: [id number] Arrival date & time: 03/07/24  2258     History {Add pertinent medical, surgical, social history, OB history to HPI:1} Chief Complaint  Patient presents with  . Hand Pain    Deborah Perry is a 23 y.o. female.  The history is provided by the patient.  Hand Pain  Deborah Perry is a 23 y.o. female who presents to the Emergency Department complaining of right hand pain.  She presents to the emergency department for evaluation of pain to her right third digit that started 3 days ago.  It did drain a little bit of fluid yesterday.  Pain is increasing, which prompted ED visit.  She is left-hand dominant and braids for living.  No medical problems.  No chance of pregnancy.     Home Medications Prior to Admission medications   Medication Sig Start Date End Date Taking? Authorizing Provider  doxycycline (VIBRAMYCIN) 100 MG capsule Take 1 capsule (100 mg total) by mouth 2 (two) times daily. 01/23/24   Meliton Rattan, PA      Allergies    Patient has no known allergies.    Review of Systems   Review of Systems  Physical Exam Updated Vital Signs BP 117/70 (BP Location: Right Arm)   Pulse 61   Temp 98.4 F (36.9 C) (Oral)   Resp 18   LMP 02/05/2024 (Exact Date)   SpO2 100%  Physical Exam  ED Results / Procedures / Treatments   Labs (all labs ordered are listed, but only abnormal results are displayed) Labs Reviewed - No data to display  EKG None  Radiology No results found.  Procedures Procedures  {Document cardiac monitor, telemetry assessment procedure when appropriate:1}  Medications Ordered in ED Medications  lidocaine (XYLOCAINE) 2 % (with pres) injection 200 mg (has no administration in time range)    ED Course/ Medical Decision Making/ A&P   {   Click here for ABCD2, HEART and other calculatorsREFRESH Note before signing :1}                               Medical Decision Making Risk Prescription drug management.   ***  {Document critical care time when appropriate:1} {Document review of labs and clinical decision tools ie heart score, Chads2Vasc2 etc:1}  {Document your independent review of radiology images, and any outside records:1} {Document your discussion with family members, caretakers, and with consultants:1} {Document social determinants of health affecting pt's care:1} {Document your decision making why or why not admission, treatments were needed:1} Final Clinical Impression(s) / ED Diagnoses Final diagnoses:  None    Rx / DC Orders ED Discharge Orders     None

## 2024-03-07 NOTE — ED Provider Notes (Signed)
 [place] EMERGENCY DEPARTMENT AT [facility] Provider Note   CSN: [id number] Arrival date & time: 03/07/24  2258     History  Chief Complaint  Patient presents with   Hand Pain    Deborah Perry is a 23 y.o. female.  The history is provided by the patient.  Hand Pain  Deborah Perry is a 23 y.o. female who presents to the Emergency Department complaining of right hand pain.  She presents to the emergency department for evaluation of pain to her right third digit that started 3 days ago.  It did drain a little bit of fluid yesterday.  Pain is increasing, which prompted ED visit.  She is left-hand dominant and braids for living.  No medical problems.  No chance of pregnancy.     Home Medications Prior to Admission medications   Medication Sig Start Date End Date Taking? Authorizing Provider  cephALEXin (KEFLEX) 500 MG capsule Take 1 capsule (500 mg total) by mouth 4 (four) times daily. 03/08/24  Yes Tilden Fossa, MD  fluconazole (DIFLUCAN) 150 MG tablet Take 1 tablet (150 mg total) by mouth daily. 03/08/24  Yes Tilden Fossa, MD  ibuprofen (ADVIL) 600 MG tablet Take 1 tablet (600 mg total) by mouth every 6 (six) hours as needed. 03/08/24  Yes Tilden Fossa, MD  doxycycline (VIBRAMYCIN) 100 MG capsule Take 1 capsule (100 mg total) by mouth 2 (two) times daily. 01/23/24   Meliton Rattan, PA      Allergies    Patient has no known allergies.    Review of Systems   Review of Systems  Physical Exam Updated Vital Signs BP 117/70 (BP Location: Right Arm)   Pulse 61   Temp 98.4 F (36.9 C) (Oral)   Resp 18   LMP 02/05/2024 (Exact Date)   SpO2 100%  Physical Exam Vitals and nursing note reviewed.  Constitutional:      Appearance: Normal appearance.  HENT:     Head: Normocephalic and atraumatic.  Musculoskeletal:     Comments: Soft tissue swelling and tenderness of the right third digit from the DIP to the tip, predominantly surrounding the nailbed.  There  is an area of purulence on the ulnar aspect.  Flexion extension intact against resistance throughout the digits.  Sensation light touch intact in the digit.  Skin:    General: Skin is warm and dry.     Capillary Refill: Capillary refill takes less than 2 seconds.  Neurological:     Mental Status: She is alert.  Psychiatric:        Mood and Affect: Mood normal.     ED Results / Procedures / Treatments   Labs (all labs ordered are listed, but only abnormal results are displayed) Labs Reviewed - No data to display  EKG None  Radiology No results found.  Procedures .Incision and Drainage  Date/Time: 03/08/2024 1:24 AM  Performed by: Tilden Fossa, MD Authorized by: Tilden Fossa, MD   Consent:    Consent obtained:  Verbal   Consent given by:  Patient   Risks discussed:  Bleeding, incomplete drainage, infection and pain   Alternatives discussed:  No treatment, alternative treatment and observation Universal protocol:    Patient identity confirmed:  Verbally with patient Location:    Indications for incision and drainage: Paronychia.   Location:  Upper extremity   Upper extremity location:  Finger   Finger location:  R long finger Pre-procedure details:    Skin preparation:  Chlorhexidine with alcohol Anesthesia:  Anesthesia method:  Nerve block   Block location:  Right third digit   Block needle gauge:  25 G   Block anesthetic:  Lidocaine 2% w/o epi   Block injection procedure:  Anatomic landmarks identified, introduced needle, negative aspiration for blood and incremental injection   Block outcome:  Incomplete block Procedure type:    Complexity:  Simple Procedure details:    Incision types:  Stab incision   Wound management:  Probed and deloculated and irrigated with saline   Drainage:  Purulent   Drainage amount:  Moderate   Wound treatment:  Wound left open   Packing materials:  None Post-procedure details:    Procedure completion:  Tolerated      Medications Ordered in ED Medications  lidocaine (XYLOCAINE) 2 % (with pres) injection 200 mg (200 mg Infiltration Given 03/08/24 0103)  Tdap (BOOSTRIX) injection 0.5 mL (0.5 mLs Intramuscular Given 03/08/24 0101)  ibuprofen (ADVIL) tablet 600 mg (600 mg Oral Given 03/08/24 0101)  cephALEXin (KEFLEX) capsule 500 mg (500 mg Oral Given 03/08/24 0101)    ED Course/ Medical Decision Making/ A&P                                 Medical Decision Making Risk OTC drugs. Prescription drug management.   Patient here for evaluation of swelling to her right third digit.  On examination she does have a paronychia.  No evidence of tendon or joint involvement.  I&D performed per note.  There is significant soft tissue swelling surrounding this area.  Would start on antibiotics given this.  Discussed home care for paronychia with outpatient follow-up and return precautions.        Final Clinical Impression(s) / ED Diagnoses Final diagnoses:  Paronychia of finger, right    Rx / DC Orders ED Discharge Orders          Ordered    cephALEXin (KEFLEX) 500 MG capsule  4 times daily        03/08/24 0047    ibuprofen (ADVIL) 600 MG tablet  Every 6 hours PRN        03/08/24 0047    fluconazole (DIFLUCAN) 150 MG tablet  Daily        03/08/24 0103              Tilden Fossa, MD 03/08/24 0126

## 2024-03-08 MED ORDER — CEPHALEXIN 500 MG PO CAPS: 500.0000 mg | ORAL_CAPSULE | Freq: Four times a day (QID) | ORAL | 0 refills | Status: DC

## 2024-03-08 MED ORDER — CEPHALEXIN 500 MG PO CAPS
500.0000 mg | ORAL_CAPSULE | Freq: Once | ORAL | Status: AC
  Administered 2024-03-08: 500 mg via ORAL
  Filled 2024-03-08: qty 1

## 2024-03-08 MED ORDER — FLUCONAZOLE 150 MG PO TABS: 150.0000 mg | ORAL_TABLET | Freq: Every day | ORAL | 0 refills | Status: DC

## 2024-03-08 MED ORDER — IBUPROFEN 600 MG PO TABS: 600.0000 mg | ORAL_TABLET | Freq: Four times a day (QID) | ORAL | 0 refills | Status: DC | PRN

## 2024-03-08 MED ORDER — IBUPROFEN 200 MG PO TABS
600.0000 mg | ORAL_TABLET | Freq: Once | ORAL | Status: AC
  Administered 2024-03-08: 600 mg via ORAL
  Filled 2024-03-08: qty 3

## 2024-04-06 ENCOUNTER — Other Ambulatory Visit: Payer: Self-pay

## 2024-04-06 ENCOUNTER — Encounter (HOSPITAL_COMMUNITY): Payer: Self-pay

## 2024-04-06 ENCOUNTER — Emergency Department (HOSPITAL_COMMUNITY)
Admission: EM | Admit: 2024-04-06 | Discharge: 2024-04-06 | Disposition: A | Discharge status: Home / Self Care | Attending: Emergency Medicine | Admitting: Emergency Medicine

## 2024-04-06 DIAGNOSIS — K625 Hemorrhage of anus and rectum: Secondary | ICD-10-CM | POA: Diagnosis not present

## 2024-04-06 DIAGNOSIS — K5900 Constipation, unspecified: Secondary | ICD-10-CM | POA: Insufficient documentation

## 2024-04-06 LAB — CBC WITH DIFFERENTIAL/PLATELET
Abs Immature Granulocytes: 0.01 10*3/uL (ref 0.00–0.07)
Basophils Absolute: 0.1 10*3/uL (ref 0.0–0.1)
Basophils Relative: 1 %
Eosinophils Absolute: 0.1 10*3/uL (ref 0.0–0.5)
Eosinophils Relative: 2 %
HCT: 36.2 % (ref 36.0–46.0)
Hemoglobin: 12.3 g/dL (ref 12.0–15.0)
Immature Granulocytes: 0 %
Lymphocytes Relative: 32 %
Lymphs Abs: 2.2 10*3/uL (ref 0.7–4.0)
MCH: 30.9 pg (ref 26.0–34.0)
MCHC: 34 g/dL (ref 30.0–36.0)
MCV: 91 fL (ref 80.0–100.0)
Monocytes Absolute: 0.7 10*3/uL (ref 0.1–1.0)
Monocytes Relative: 10 %
Neutro Abs: 3.8 10*3/uL (ref 1.7–7.7)
Neutrophils Relative %: 55 %
Platelets: 170 10*3/uL (ref 150–400)
RBC: 3.98 MIL/uL (ref 3.87–5.11)
RDW: 12.3 % (ref 11.5–15.5)
WBC: 6.8 10*3/uL (ref 4.0–10.5)
nRBC: 0 % (ref 0.0–0.2)

## 2024-04-06 LAB — POC OCCULT BLOOD, ED: Fecal Occult Bld: POSITIVE — AB

## 2024-04-06 LAB — WET PREP, GENITAL
Sperm: NONE SEEN
Trich, Wet Prep: NONE SEEN
WBC, Wet Prep HPF POC: >=10 — AB (ref <10)
Yeast Wet Prep HPF POC: NONE SEEN

## 2024-04-06 MED ORDER — FLEET ENEMA RE ENEM
1.0000 | ENEMA | Freq: Once | RECTAL | Status: AC
  Administered 2024-04-06: 1 via RECTAL
  Filled 2024-04-06: qty 1

## 2024-04-06 NOTE — ED Triage Notes (Signed)
 Pt unable to have a bowel movement for 5 days. No laxatives or stool softeners at home.

## 2024-04-06 NOTE — ED Provider Notes (Signed)
 [place] EMERGENCY DEPARTMENT AT [facility] Provider Note   CSN: [id number] Arrival date & time: 04/06/24  2016     History  Chief Complaint  Patient presents with   Constipation    Deborah Perry is a 23 y.o. female with PMH as listed below who presents with constipation. Pt unable to have a bowel movement for 5 days. No laxatives or stool softeners at home.  Yesterday was straining very hard and noticed some bright red blood when wiping and a little bit in the bowl, also had some today.  She reports she is not bleeding from her rectum when she is not straining.  Has never had a hemorrhoid that she is aware of but thinks there might be 1 now.  No lightheadedness, shortness of breath, chest pain.  No abdominal pain, nausea vomiting.  Is still passing gas.  Otherwise feels well and in her normal state of health.   History reviewed. No pertinent past medical history.     Home Medications Prior to Admission medications   Medication Sig Start Date End Date Taking? Authorizing Provider  cephALEXin  (KEFLEX ) 500 MG capsule Take 1 capsule (500 mg total) by mouth 4 (four) times daily. 03/08/24   Kelsey Patricia, MD  doxycycline  (VIBRAMYCIN ) 100 MG capsule Take 1 capsule (100 mg total) by mouth 2 (two) times daily. 01/23/24   Acevedo, Angela, PA  fluconazole  (DIFLUCAN ) 150 MG tablet Take 1 tablet (150 mg total) by mouth daily. 03/08/24   Kelsey Patricia, MD  ibuprofen  (ADVIL ) 600 MG tablet Take 1 tablet (600 mg total) by mouth every 6 (six) hours as needed. 03/08/24   Kelsey Patricia, MD      Allergies    Patient has no known allergies.    Review of Systems   Review of Systems A 10 point review of systems was performed and is negative unless otherwise reported in HPI.  Physical Exam Updated Vital Signs BP 131/70   Pulse (!) 59   Temp 97.6 F (36.4 C) (Oral)   Resp 18   Ht 5\' 5"  (1.651 m)   Wt 72.6 kg   SpO2 100%   BMI 26.63 kg/m  Physical Exam General: Normal  appearing female, lying in bed.  HEENT: PERRLA, Sclera anicteric, MMM, trachea midline.  Cardiology: RRR, no murmurs/rubs/gallops.  Resp: Normal respiratory rate and effort. CTAB, no wheezes, rhonchi, crackles.  Abd: Soft, non-tender, non-distended. No rebound tenderness or guarding.  Rectal: Performed with RN chaperone.  Normal-appearing external anal sphincter with no hemorrhoids noted.  Stool palpated in the rectal vault without any other masses or abnormalities, no tenderness to palpation.  Brown stool on glove.  Fecal occult positive. MSK: No peripheral edema or signs of trauma. Skin: warm, dry.  Back: No CVA tenderness Neuro: A&Ox4, CNs II-XII grossly intact. MAEs. Sensation grossly intact.  Psych: Normal mood and affect.   ED Results / Procedures / Treatments   Labs (all labs ordered are listed, but only abnormal results are displayed) Labs Reviewed  POC OCCULT BLOOD, ED - Abnormal; Notable for the following components:      Result Value   Fecal Occult Bld POSITIVE (*)    All other components within normal limits  CBC WITH DIFFERENTIAL/PLATELET    EKG None  Radiology No results found.  Procedures Procedures    Medications Ordered in ED Medications  sodium phosphate (FLEET) enema 1 enema (1 enema Rectal Given 04/06/24 2301)    ED Course/ Medical Decision Making/ A&P  Medical Decision Making Amount and/or Complexity of Data Reviewed Labs: ordered. Decision-making details documented in ED Course.  Risk OTC drugs.    MDM:    No hemorrhoids, anal fissure, rectal foreign body on digital rectal exam.  No gross blood on glove.  POC fecal occult was positive though no gross blood, no signs of hemorrhage, HDS, well-appearing. Hgb/platelets wnl.  Possible internal hemorrhoid given straining clinical history. Also consider diverticulosis, AVM.  Shared decision making with patient who would prefer to do an enema.  We discussed that it could possibly  irritate or cause further bleeding from the possible internal hemorrhoid, also suggested oral laxatives or even a suppository, but patient reports that she would like to move forward with the enema for immediate relief of her constipation, which was very helpful.  Advised f/u with gastroenterology and PCP.   Clinical Course as of 04/06/24 2322  Sat Apr 06, 2024  2215 Fecal Occult Blood, POC(!): POSITIVE [HN]  2316 Hemoglobin: 12.3 wnl [HN]  2316 Platelets: 170 wnl [HN]    Clinical Course User Index [HN] Merdis Stalling, MD    Labs: I Ordered, and personally interpreted labs.  The pertinent results include: Fecal occult blood is negative  Additional history obtained from chart review.    Reevaluation: After the interventions noted above, I reevaluated the patient and found that they have :resolved  Social Determinants of Health:  lives independently  Disposition:  DC w/ discharge instructions/return precautions. All questions answered to patient's satisfaction.    Co morbidities that complicate the patient evaluation History reviewed. No pertinent past medical history.   Medicines Meds ordered this encounter  Medications   sodium phosphate (FLEET) enema 1 enema    I have reviewed the patients home medicines and have made adjustments as needed  Problem List / ED Course: Problem List Items Addressed This Visit   None Visit Diagnoses       Constipation, unspecified constipation type    -  Primary     Rectal bleeding                       This note was created using dictation software, which may contain spelling or grammatical errors.    Merdis Stalling, MD 04/06/24 2322

## 2024-04-06 NOTE — Discharge Instructions (Addendum)
 Thank you for coming to [facility] Emergency Department. You were seen for constipation. You had no blood visualized on your exam. We did an enema which helped.   Please use 1-2 capfuls of miralax per day and drink ~64 oz of water per day to help prevent constipation.   For your rectal bleeding, please follow up with a gastroenterologist. You can call them on Monday morning to make a follow-up appointment.  Please follow up with your primary care provider within 1 week.   Do not hesitate to return to the ED or call 911 if you experience: -Worsening symptoms -Inability to have a bowel movement or pas gas -Severe abdominal pain, nausea/vomiting so severe you cannot eat/drink -Lightheadedness, passing out -Fevers/chills -Anything else that concerns you

## 2024-04-08 LAB — GC/CHLAMYDIA PROBE AMP (~~LOC~~) NOT AT ARMC
Chlamydia: NEGATIVE
Comment: NEGATIVE
Comment: NORMAL
Neisseria Gonorrhea: NEGATIVE

## 2024-04-11 ENCOUNTER — Other Ambulatory Visit: Payer: Self-pay

## 2024-04-11 ENCOUNTER — Ambulatory Visit
Admission: EM | Admit: 2024-04-11 | Discharge: 2024-04-11 | Disposition: A | Discharge status: Home / Self Care | Attending: Physician Assistant | Admitting: Physician Assistant

## 2024-04-11 DIAGNOSIS — R102 Pelvic and perineal pain: Secondary | ICD-10-CM | POA: Insufficient documentation

## 2024-04-11 DIAGNOSIS — L292 Pruritus vulvae: Secondary | ICD-10-CM | POA: Diagnosis not present

## 2024-04-11 MED ORDER — FLUCONAZOLE 150 MG PO TABS: 150.0000 mg | ORAL_TABLET | Freq: Every day | ORAL | 0 refills | Status: DC

## 2024-04-11 NOTE — ED Triage Notes (Signed)
 Pt presents with complaints of white, thick vaginal discharge that started yesterday, 5/7. Pt currently denies pain. Denies unusual odors as well. Pt does state she had vaginal itching yesterday however this has now resolved. Denies taking or applying medications PTA.

## 2024-04-11 NOTE — ED Provider Notes (Signed)
 [facility]    CSN: [id number] Arrival date & time: 04/11/24  1531      History   Chief Complaint Chief Complaint  Patient presents with   Vaginal Discharge    HPI Deborah Perry is a 23 y.o. female.   HPI  She reports concerns for yeast infection  She states she has had vulvovaginal irritation for the past few days and then started to have thick, white vaginal discharge yesterday  She denies fever, chills, abdominal pain, dysuria She denies new medications  She reports previous hx of yeast infections and states this feels similar    History reviewed. No pertinent past medical history.  Patient Active Problem List   Diagnosis Date Noted   Trichomoniasis 12/12/2019   Vaginal bleeding 12/12/2019   Chlamydia 12/12/2019   Rash 10/23/2017   Obesity 08/21/2015    Past Surgical History:  Procedure Laterality Date   NO PAST SURGERIES      OB History     Gravida  0   Para  0   Term  0   Preterm  0   AB  0   Living  0      SAB  0   IAB  0   Ectopic  0   Multiple  0   Live Births  0            Home Medications    Prior to Admission medications   Medication Sig Start Date End Date Taking? Authorizing Provider  fluconazole  (DIFLUCAN ) 150 MG tablet Take 1 tablet (150 mg total) by mouth daily. 04/11/24  Yes Lissandra Keil E, PA-C  cephALEXin  (KEFLEX ) 500 MG capsule Take 1 capsule (500 mg total) by mouth 4 (four) times daily. 03/08/24   Kelsey Patricia, MD    Family History Family History  Problem Relation Age of Onset   Hypertension Mother    Diabetes Mother    Kidney Stones Mother    Colon cancer Mother    Stroke Maternal Grandfather     Social History Social History   Tobacco Use   Smoking status: Never    Passive exposure: Yes   Smokeless tobacco: Never  Vaping Use   Vaping status: Never Used  Substance Use Topics   Alcohol use: No   Drug use: No     Allergies   Patient has no known allergies.   Review of  Systems Review of Systems  Constitutional:  Negative for chills and fever.  Gastrointestinal:  Negative for abdominal pain.  Genitourinary:  Positive for vaginal discharge. Negative for dysuria, vaginal bleeding and vaginal pain.     Physical Exam Triage Vital Signs ED Triage Vitals  Encounter Vitals Group     BP 04/11/24 1544 (!) 105/59     Systolic BP Percentile --      Diastolic BP Percentile --      Pulse Rate 04/11/24 1544 (!) 54     Resp 04/11/24 1544 14     Temp 04/11/24 1544 97.9 F (36.6 C)     Temp Source 04/11/24 1544 Oral     SpO2 04/11/24 1544 97 %     Weight 04/11/24 1544 165 lb (74.8 kg)     Height 04/11/24 1544 5\' 5"  (1.651 m)     Head Circumference --      Peak Flow --      Pain Score 04/11/24 1542 0     Pain Loc --      Pain Education --  Exclude from Growth Chart --    No data found.  Updated Vital Signs BP (!) 105/59 (BP Location: Right Arm)   Pulse (!) 54   Temp 97.9 F (36.6 C) (Oral)   Resp 14   Ht 5\' 5"  (1.651 m)   Wt 165 lb (74.8 kg)   LMP 03/25/2024 (Exact Date)   SpO2 97%   BMI 27.46 kg/m   Visual Acuity Right Eye Distance:   Left Eye Distance:   Bilateral Distance:    Right Eye Near:   Left Eye Near:    Bilateral Near:     Physical Exam Vitals reviewed.  Constitutional:      General: She is awake.     Appearance: Normal appearance. She is well-developed and well-groomed.  HENT:     Head: Normocephalic and atraumatic.  Eyes:     General: Lids are normal. Gaze aligned appropriately.     Extraocular Movements: Extraocular movements intact.     Conjunctiva/sclera: Conjunctivae normal.  Pulmonary:     Effort: Pulmonary effort is normal.  Neurological:     General: No focal deficit present.     Mental Status: She is alert and oriented to person, place, and time.     GCS: GCS eye subscore is 4. GCS verbal subscore is 5. GCS motor subscore is 6.     Cranial Nerves: No cranial nerve deficit, dysarthria or facial asymmetry.   Psychiatric:        Attention and Perception: Attention and perception normal.        Mood and Affect: Mood and affect normal.        Speech: Speech normal.        Behavior: Behavior normal. Behavior is cooperative.      UC Treatments / Results  Labs (all labs ordered are listed, but only abnormal results are displayed) Labs Reviewed  CERVICOVAGINAL ANCILLARY ONLY    EKG   Radiology No results found.  Procedures Procedures (including critical care time)  Medications Ordered in UC Medications - No data to display  Initial Impression / Assessment and Plan / UC Course  I have reviewed the triage vital signs and the nursing notes.  Pertinent labs & imaging results that were available during my care of the patient were reviewed by me and considered in my medical decision making (see chart for details).      Final Clinical Impressions(s) / UC Diagnoses   Final diagnoses:  Vulvovaginal discomfort  Vulvovaginal itching   Patient presents today with concerns for vulvovaginal yeast infection.  She reports that she has been having some vaginal irritation for the past few days and also developed white, thick vaginal discharge.  She reports this is similar to previous instances of vulvovaginal yeast infection that she has had in the past.  Will go ahead and send 2 tablets of Diflucan  to assist with symptoms.  Cervicovaginal swab collected to assess for BV, trichomonas, yeast, gonorrhea, chlamydia.  Results to dictate further management.  Return and follow-up precautions reviewed and provided in after visit summary.    Discharge Instructions      You were seen today for concerns of a vulvovaginal yeast infection.  I have sent in 2 tablets of Diflucan  for you to use to help manage this.  We have collected a cervicovaginal swab to assess for bacterial vaginosis, trichomonas, yeast, gonorrhea and chlamydia.  We will keep you updated on those results once they are available and send  in any medications as indicated by  those results.  If you have any questions or concerns you can always call us  or follow-up in clinic for persistent or progressing symptoms.   ED Prescriptions     Medication Sig Dispense Auth. Provider   fluconazole  (DIFLUCAN ) 150 MG tablet Take 1 tablet (150 mg total) by mouth daily. 2 tablet Sumiya Mamaril E, PA-C      PDMP not reviewed this encounter.   Jerona Mooring, PA-C 04/11/24 1607

## 2024-04-11 NOTE — Discharge Instructions (Signed)
 You were seen today for concerns of a vulvovaginal yeast infection.  I have sent in 2 tablets of Diflucan  for you to use to help manage this.  We have collected a cervicovaginal swab to assess for bacterial vaginosis, trichomonas, yeast, gonorrhea and chlamydia.  We will keep you updated on those results once they are available and send in any medications as indicated by those results.  If you have any questions or concerns you can always call us  or follow-up in clinic for persistent or progressing symptoms.

## 2024-04-14 ENCOUNTER — Telehealth: Payer: Self-pay | Admitting: Internal Medicine

## 2024-04-14 NOTE — Telephone Encounter (Signed)
 Patient calls urgent care requesting results from vaginal swab performed on Apr 12, 2024 (2 days ago). Vaginal swab results have not come back yet and will likely result in the next few days.  The delay in processing her vaginal swab is likely due to cytology lab closure on the weekends.  Advised to call back on Wednesday, Apr 18, 2024 if she still has not received results. She states her symptoms are improving with use of Diflucan .

## 2024-04-15 ENCOUNTER — Telehealth (HOSPITAL_COMMUNITY): Payer: Self-pay

## 2024-04-15 LAB — CERVICOVAGINAL ANCILLARY ONLY
Bacterial Vaginitis (gardnerella): POSITIVE — AB
Candida Glabrata: NEGATIVE
Candida Vaginitis: POSITIVE — AB
Chlamydia: NEGATIVE
Comment: NEGATIVE
Comment: NEGATIVE
Comment: NEGATIVE
Comment: NEGATIVE
Comment: NEGATIVE
Comment: NORMAL
Neisseria Gonorrhea: NEGATIVE
Trichomonas: NEGATIVE

## 2024-04-15 MED ORDER — METRONIDAZOLE 500 MG PO TABS: 500.0000 mg | ORAL_TABLET | Freq: Two times a day (BID) | ORAL | 0 refills | Status: AC

## 2024-04-15 NOTE — Telephone Encounter (Signed)
 Per protocol, pt requires tx with metronidazole. Rx sent to pharmacy on file.

## 2024-04-29 ENCOUNTER — Telehealth: Admitting: Physician Assistant

## 2024-04-29 DIAGNOSIS — B3731 Acute candidiasis of vulva and vagina: Secondary | ICD-10-CM | POA: Diagnosis not present

## 2024-04-29 MED ORDER — FLUCONAZOLE 150 MG PO TABS: 150.0000 mg | ORAL_TABLET | ORAL | 0 refills | Status: DC | PRN

## 2024-04-29 NOTE — Progress Notes (Signed)

## 2024-06-05 ENCOUNTER — Telehealth: Admitting: Physician Assistant

## 2024-06-05 DIAGNOSIS — N760 Acute vaginitis: Secondary | ICD-10-CM

## 2024-06-05 DIAGNOSIS — B9689 Other specified bacterial agents as the cause of diseases classified elsewhere: Secondary | ICD-10-CM

## 2024-06-05 MED ORDER — METRONIDAZOLE 500 MG PO TABS: 500.0000 mg | ORAL_TABLET | Freq: Two times a day (BID) | ORAL | 0 refills | Status: DC

## 2024-06-05 NOTE — Progress Notes (Signed)
 I have spent 5 minutes in review of e-visit questionnaire, review and updating patient chart, medical decision making and response to patient.   Piedad Climes, PA-C

## 2024-06-05 NOTE — Progress Notes (Signed)

## 2024-06-13 ENCOUNTER — Telehealth: Payer: Self-pay

## 2024-06-13 DIAGNOSIS — Z7189 Other specified counseling: Secondary | ICD-10-CM

## 2024-06-13 NOTE — Progress Notes (Signed)
 Complex Care Management Note  Care Guide Note 06/13/2024 Name: Sharyn Brilliant MRN: [id number] DOB: [birth year]  Kathelene Rumberger is a 23 y.o. year old female who sees Pcp, No for primary care. I reached out to [facility] by phone today to offer complex care management services.  Ms. Murray was given information about Complex Care Management services today including:   The Complex Care Management services include support from the care team which includes your Nurse Care Manager, Clinical Social Worker, or Pharmacist.  The Complex Care Management team is here to help remove barriers to the health concerns and goals most important to you. Complex Care Management services are voluntary, and the patient may decline or stop services at any time by request to their care team member.   Complex Care Management Consent Status: Patient agreed to services and verbal consent obtained.   Follow up plan:  Telephone appointment with complex care management team member scheduled for:  BSW 06/20/2024  Encounter Outcome:  Patient Scheduled  Jeoffrey Buffalo , RMA     [place]  [facility], [facility] Guide  Direct Dial: [phone]  Website: [email or web address]

## 2024-06-13 NOTE — Progress Notes (Signed)
 Complex Care Management Note Care Guide Note  06/13/2024 Name: Deborah Perry MRN: [id number] DOB: [birth year]   Complex Care Management Outreach Attempts: An unsuccessful telephone outreach was attempted today to offer the patient information about available complex care management services.  Follow Up Plan:  Additional outreach attempts will be made to offer the patient complex care management information and services.   Encounter Outcome:  No Answer  Jeoffrey Buffalo , RMA     [place]  [facility], [facility] Guide  Direct Dial: [phone]  Website: [email or web address]

## 2024-06-20 ENCOUNTER — Telehealth: Payer: Self-pay

## 2024-07-02 ENCOUNTER — Telehealth: Payer: Self-pay

## 2024-07-02 ENCOUNTER — Ambulatory Visit
Admission: EM | Admit: 2024-07-02 | Discharge: 2024-07-02 | Disposition: A | Discharge status: Home / Self Care | Attending: Family Medicine | Admitting: Family Medicine

## 2024-07-02 DIAGNOSIS — N898 Other specified noninflammatory disorders of vagina: Secondary | ICD-10-CM | POA: Insufficient documentation

## 2024-07-02 DIAGNOSIS — Z113 Encounter for screening for infections with a predominantly sexual mode of transmission: Secondary | ICD-10-CM | POA: Diagnosis not present

## 2024-07-02 DIAGNOSIS — N760 Acute vaginitis: Secondary | ICD-10-CM | POA: Insufficient documentation

## 2024-07-02 DIAGNOSIS — B3731 Acute candidiasis of vulva and vagina: Secondary | ICD-10-CM | POA: Insufficient documentation

## 2024-07-02 DIAGNOSIS — B9689 Other specified bacterial agents as the cause of diseases classified elsewhere: Secondary | ICD-10-CM | POA: Insufficient documentation

## 2024-07-02 MED ORDER — FLUCONAZOLE 150 MG PO TABS: 150.0000 mg | ORAL_TABLET | ORAL | 0 refills | Status: DC

## 2024-07-02 MED ORDER — METRONIDAZOLE 500 MG PO TABS: 500.0000 mg | ORAL_TABLET | Freq: Two times a day (BID) | ORAL | 0 refills | Status: DC

## 2024-07-02 NOTE — Discharge Instructions (Addendum)
 At your request, I have sent prescriptions for a suspected recurrent BV infection.  Will treat this with metronidazole .  As you get yeast infections with antibiotic use, I am also prescribing fluconazole .  Will let you know about your test results tomorrow and treat any infections that you test positive for.

## 2024-07-02 NOTE — Progress Notes (Signed)
 Complex Care Management Care Guide Note  07/02/2024 Name: Yachet Mattson MRN: [id number] DOB: 2001-01-12  Kameran Lallier is a 23 y.o. year old female who is a primary care patient of Pcp, No and is actively engaged with the care management team. I reached out to [facility] by phone today to assist with re-scheduling  with the BSW.  Follow up plan: Telephone appointment with complex care management team member scheduled for:  07/05/2024  Jeoffrey Buffalo , RMA     [place]  [facility], [facility] Guide  Direct Dial: [phone]  Website: [email or web address]

## 2024-07-02 NOTE — ED Triage Notes (Signed)
 Patient presents to the office for vaginal odor that started today.  Patient request STI-testing with blood work.

## 2024-07-02 NOTE — ED Provider Notes (Signed)
 [facility]  Note:  This document was prepared using Conservation officer, historic buildings and may include unintentional dictation errors.  MRN: [id number] DOB: [birth year]  Subjective:   Deborah Perry is a 23 y.o. female presenting for 1 week history of a strong vaginal malodor.  Denies fever, n/v, abdominal pain, pelvic pain, rashes, dysuria, urinary frequency, hematuria.  Last tested positive for bacterial vaginosis and yeast infection 04/11/2024.  She underwent treatment for chlamydia 01/22/2024.  Had negative HIV and syphilis testing from the same visit.  Would like testing for HIV, syphilis and a vaginal cytology.  Declines UPT.  No current facility-administered medications for this encounter.  Current Outpatient Medications:    cephALEXin  (KEFLEX ) 500 MG capsule, Take 1 capsule (500 mg total) by mouth 4 (four) times daily., Disp: 20 capsule, Rfl: 0   fluconazole  (DIFLUCAN ) 150 MG tablet, Take 1 tablet (150 mg total) by mouth every 3 (three) days as needed., Disp: 2 tablet, Rfl: 0   metroNIDAZOLE  (FLAGYL ) 500 MG tablet, Take 1 tablet (500 mg total) by mouth 2 (two) times daily., Disp: 14 tablet, Rfl: 0   No Known Allergies  History reviewed. No pertinent past medical history.   Past Surgical History:  Procedure Laterality Date   NO PAST SURGERIES      Family History  Problem Relation Age of Onset   Hypertension Mother    Diabetes Mother    Kidney Stones Mother    Colon cancer Mother    Stroke Maternal Grandfather     Social History   Tobacco Use   Smoking status: Never    Passive exposure: Yes   Smokeless tobacco: Never  Vaping Use   Vaping status: Never Used  Substance Use Topics   Alcohol use: No   Drug use: No    ROS   Objective:   Vitals: BP 106/68 (BP Location: Left Arm)   Pulse (!) 55   Temp 98.5 F (36.9 C) (Oral)   Resp 18   LMP 06/20/2024 (Approximate)   SpO2 98%   Physical Exam Constitutional:      General: She is  not in acute distress.    Appearance: Normal appearance. She is well-developed. She is not ill-appearing, toxic-appearing or diaphoretic.  HENT:     Head: Normocephalic and atraumatic.     Nose: Nose normal.     Mouth/Throat:     Mouth: Mucous membranes are moist.  Eyes:     General: No scleral icterus.       Right eye: No discharge.        Left eye: No discharge.     Extraocular Movements: Extraocular movements intact.  Cardiovascular:     Rate and Rhythm: Normal rate.  Pulmonary:     Effort: Pulmonary effort is normal.  Skin:    General: Skin is warm and dry.  Neurological:     General: No focal deficit present.     Mental Status: She is alert and oriented to person, place, and time.  Psychiatric:        Mood and Affect: Mood normal.        Behavior: Behavior normal.     Assessment and Plan :   PDMP not reviewed this encounter.  1. Bacterial vaginosis   2. Screen for STD (sexually transmitted disease)   3. Vaginal odor   4. Yeast vaginitis    We will treat patient empirically for bacterial vaginosis with Flagyl  and for yeast vaginitis with fluconazole .  Labs pending.  Counseled patient on potential for adverse effects with medications prescribed/recommended today, ER and return-to-clinic precautions discussed, patient verbalized understanding.    Christopher Savannah, [place] 07/02/24 1734

## 2024-07-03 LAB — RPR: RPR Ser Ql: NONREACTIVE

## 2024-07-03 LAB — CERVICOVAGINAL ANCILLARY ONLY
Bacterial Vaginitis (gardnerella): POSITIVE — AB
Candida Glabrata: NEGATIVE
Candida Vaginitis: POSITIVE — AB
Chlamydia: POSITIVE — AB
Comment: NEGATIVE
Comment: NEGATIVE
Comment: NEGATIVE
Comment: NEGATIVE
Comment: NEGATIVE
Comment: NORMAL
Neisseria Gonorrhea: NEGATIVE
Trichomonas: NEGATIVE

## 2024-07-03 LAB — HIV ANTIBODY (ROUTINE TESTING W REFLEX): HIV Screen 4th Generation wRfx: NONREACTIVE

## 2024-07-04 ENCOUNTER — Ambulatory Visit: Payer: Self-pay

## 2024-07-04 MED ORDER — DOXYCYCLINE HYCLATE 100 MG PO TABS
100.0000 mg | ORAL_TABLET | Freq: Two times a day (BID) | ORAL | 0 refills | Status: AC
Start: 2024-07-04 — End: 2024-07-11

## 2024-07-05 ENCOUNTER — Other Ambulatory Visit: Payer: Self-pay

## 2024-07-05 NOTE — Patient Outreach (Signed)
 Complex Care Management   Visit Note  07/05/2024  Name:  Deborah Perry MRN: [id number] DOB: [birth year]  Situation: Referral received for Complex Care Management related to SDOH Barriers:  Establish medical provider I obtained verbal consent from Patient.  Visit completed with patient  on the phone  Background:  No past medical history on file.  Assessment:  Patient needs to establish a PCP. SW t/c Physician Referral line and patient is established with Dr. Darice Brownie to be seen 07/08/24 at 8:50am. Patient reports no other concerns.    SDOH Interventions    Flowsheet Row Patient Outreach Telephone from 07/05/2024 in [place] POPULATION HEALTH DEPARTMENT  SDOH Interventions   Food Insecurity Interventions Intervention Not Indicated  [Gets foodstamps]  Housing Interventions Intervention Not Indicated  [Live with parents]  Transportation Interventions Intervention Not Indicated  [Has a car]  Utilities Interventions Intervention Not Indicated  Financial Strain Interventions Intervention Not Indicated  [Family is providing support]      Recommendation:   none  Follow Up Plan:   Patient has met all care management goals. Care Management case will be closed. Patient has been provided contact information should new needs arise.   Tillman Gardener, BSW [place]  [facility], [facility] Social Worker Direct Dial: [phone]  Fax: [phone] Website: [email or web address]

## 2024-07-05 NOTE — Patient Instructions (Signed)
 Visit Information  Thank you for taking time to visit with me today. Please don't hesitate to contact me if I can be of assistance to you before our next scheduled appointment.    Following is a copy of your care plan:   Goals Addressed             This Visit's Progress    BSW-VBCI Social Work Care Plan       Problems:   Patient needs to establish a PCP.  CSW Clinical Goal(s):   Over the next 3 days the Patient will attend appointment with Primary Care [facility].  Interventions:  Social Determinants of Health in Patient with none: SDOH assessments completed: Patient needs to establish a PCP. Evaluation of current treatment plan related to unmet needs SW t/c Physician Referral line and patient is established with Dr. Darice Brownie to be seen 07/08/24 at 8:50am.  Patient Goals/Self-Care Activities:  Patient will attend visit with provider.  Plan:   No further follow up required: Patient does not request a follow up.        Please call 911 if you are experiencing a Mental Health or Behavioral Health Crisis or need someone to talk to.  Patient verbalizes understanding of instructions and care plan provided today and agrees to view in MyChart. Active MyChart status and patient understanding of how to access instructions and care plan via MyChart confirmed with patient.     Tillman Gardener, BSW   [facility], [facility] Social Worker Direct Dial: [phone]  Fax: [phone] Website: [email or web address]

## 2024-07-08 ENCOUNTER — Ambulatory Visit: Admitting: Family Medicine

## 2024-07-16 ENCOUNTER — Ambulatory Visit: Admitting: Family Medicine

## 2024-07-16 DIAGNOSIS — Z7689 Persons encountering health services in other specified circumstances: Secondary | ICD-10-CM | POA: Insufficient documentation

## 2024-07-22 ENCOUNTER — Ambulatory Visit
Admission: EM | Admit: 2024-07-22 | Discharge: 2024-07-22 | Disposition: A | Discharge status: Home / Self Care | Attending: Physician Assistant | Admitting: Physician Assistant

## 2024-07-22 ENCOUNTER — Other Ambulatory Visit: Payer: Self-pay

## 2024-07-22 DIAGNOSIS — A749 Chlamydial infection, unspecified: Secondary | ICD-10-CM | POA: Diagnosis not present

## 2024-07-22 DIAGNOSIS — Z113 Encounter for screening for infections with a predominantly sexual mode of transmission: Secondary | ICD-10-CM | POA: Diagnosis not present

## 2024-07-22 DIAGNOSIS — Z8619 Personal history of other infectious and parasitic diseases: Secondary | ICD-10-CM | POA: Insufficient documentation

## 2024-07-22 NOTE — ED Provider Notes (Signed)
 [facility]    CSN: [id number] Arrival date & time: 07/22/24  1518      History   Chief Complaint Chief Complaint  Patient presents with   SEXUALLY TRANSMITTED DISEASE   Follow-up    HPI Deborah Perry is a 23 y.o. female.   HPI  Pt presents today for test of cure after being treated for chlamydia when she was seen on 07/02/24 She reports she has not been having any symptoms including abdominal pain, fever , chills,vaginal discharge or abnormal vaginal bleeding She has completed her Doxycycline  course in its entirety She has not been sexually active since she was treated   History reviewed. No pertinent past medical history.  Patient Active Problem List   Diagnosis Date Noted   Establishing care with new doctor, encounter for 07/16/2024   Trichomoniasis 12/12/2019   Vaginal bleeding 12/12/2019   Chlamydia 12/12/2019   Rash 10/23/2017   Obesity 08/21/2015    Past Surgical History:  Procedure Laterality Date   NO PAST SURGERIES      OB History     Gravida  0   Para  0   Term  0   Preterm  0   AB  0   Living  0      SAB  0   IAB  0   Ectopic  0   Multiple  0   Live Births  0            Home Medications    Prior to Admission medications   Medication Sig Start Date End Date Taking? Authorizing Provider  cephALEXin  (KEFLEX ) 500 MG capsule Take 1 capsule (500 mg total) by mouth 4 (four) times daily. 03/08/24   Griselda Norris, MD  fluconazole  (DIFLUCAN ) 150 MG tablet Take 1 tablet (150 mg total) by mouth once a week. 07/02/24   Christopher Savannah, PA-C  metroNIDAZOLE  (FLAGYL ) 500 MG tablet Take 1 tablet (500 mg total) by mouth 2 (two) times daily. 07/02/24   Christopher Savannah, PA-C    Family History Family History  Problem Relation Age of Onset   Hypertension Mother    Diabetes Mother    Kidney Stones Mother    Colon cancer Mother    Stroke Maternal Grandfather     Social History Social History   Tobacco Use   Smoking status:  Never    Passive exposure: Yes   Smokeless tobacco: Never  Vaping Use   Vaping status: Never Used  Substance Use Topics   Alcohol use: No   Drug use: No     Allergies   Patient has no known allergies.   Review of Systems Review of Systems  Constitutional:  Negative for chills and fever.  Gastrointestinal:  Negative for abdominal pain.  Genitourinary:  Negative for dysuria, genital sores, pelvic pain, vaginal bleeding, vaginal discharge and vaginal pain.  Skin:  Negative for rash.     Physical Exam Triage Vital Signs ED Triage Vitals  Encounter Vitals Group     BP 07/22/24 1534 (!) 103/51     Girls Systolic BP Percentile --      Girls Diastolic BP Percentile --      Boys Systolic BP Percentile --      Boys Diastolic BP Percentile --      Pulse Rate 07/22/24 1534 (!) 46     Resp 07/22/24 1534 16     Temp 07/22/24 1534 98.3 F (36.8 C)     Temp Source 07/22/24 1534 Oral  SpO2 07/22/24 1534 98 %     Weight 07/22/24 1534 164 lb (74.4 kg)     Height 07/22/24 1534 5' 5 (1.651 m)     Head Circumference --      Peak Flow --      Pain Score 07/22/24 1550 0     Pain Loc --      Pain Education --      Exclude from Growth Chart --    No data found.  Updated Vital Signs BP (!) 103/51 (BP Location: Right Arm)   Pulse (!) 46   Temp 98.3 F (36.8 C) (Oral)   Resp 16   Ht 5' 5 (1.651 m)   Wt 164 lb (74.4 kg)   LMP 07/19/2024 (Approximate)   SpO2 98%   BMI 27.29 kg/m   Visual Acuity Right Eye Distance:   Left Eye Distance:   Bilateral Distance:    Right Eye Near:   Left Eye Near:    Bilateral Near:     Physical Exam Vitals reviewed.  Constitutional:      General: She is awake.     Appearance: Normal appearance. She is well-developed and well-groomed.  HENT:     Head: Normocephalic and atraumatic.  Eyes:     General: Lids are normal. Gaze aligned appropriately.     Extraocular Movements: Extraocular movements intact.     Conjunctiva/sclera:  Conjunctivae normal.  Pulmonary:     Effort: Pulmonary effort is normal.  Neurological:     Mental Status: She is alert and oriented to person, place, and time.  Psychiatric:        Attention and Perception: Attention and perception normal.        Mood and Affect: Mood and affect normal.        Speech: Speech normal.        Behavior: Behavior normal. Behavior is cooperative.      UC Treatments / Results  Labs (all labs ordered are listed, but only abnormal results are displayed) Labs Reviewed  CERVICOVAGINAL ANCILLARY ONLY    EKG   Radiology No results found.  Procedures Procedures (including critical care time)  Medications Ordered in UC Medications - No data to display  Initial Impression / Assessment and Plan / UC Course  I have reviewed the triage vital signs and the nursing notes.  Pertinent labs & imaging results that were available during my care of the patient were reviewed by me and considered in my medical decision making (see chart for details).      Final Clinical Impressions(s) / UC Diagnoses   Final diagnoses:  Screening examination for STD (sexually transmitted disease)  Chlamydia   Patient presents today with 4 follow-up STD testing and test of cure.  She was recently treated for chlamydia after testing positive for this on 07/02/2024.  She states that she has completed her doxycycline  prescription as directed without issue and is no longer having symptoms.  I reviewed with patient that we typically complete a test of cure about a month after completing a successful antibiotic regimen.  Reviewed that it is possible that the test may come back positive and if this is the case and she is still not having symptoms recommend retesting in 1 to 2 weeks to ensure that this is truly a positive test result versus false positive.  Cervicovaginal swab collected to assess for BV, yeast, trichomoniasis, gonorrhea, chlamydia.  Patient has refrain from sexual activity  since completing her antibiotic regimen.  Recommend  that she continues to refrain until test results are completely negative.  Follow-up as needed and dictated by test results    Discharge Instructions      We will keep you updated on the results of your cervicovaginal swab once the results are available.  If medication or treatment is indicated by those results that will be sent into the pharmacy that we have on file. Please make sure that you are practicing safe sex and using barrier methods to prevent exposure. It is recommended to avoid intercourse until you have the results back from testing and have completed any treatments that are sent in for you.       ED Prescriptions   None    PDMP not reviewed this encounter.   Karlisha Mathena, [place] BRAVO, PA-C 07/22/24 1626

## 2024-07-22 NOTE — ED Triage Notes (Signed)
 Pt presents to urgent care for follow-up STD testing. Currently denies symptoms or pain. Pt states she tested positive for Chlamydia on 7/29. Would like reassurance that this is now negative being she finished her doxycycline  prescription.

## 2024-07-22 NOTE — Discharge Instructions (Signed)
 We will keep you updated on the results of your cervicovaginal swab once the results are available.  If medication or treatment is indicated by those results that will be sent into the pharmacy that we have on file. Please make sure that you are practicing safe sex and using barrier methods to prevent exposure. It is recommended to avoid intercourse until you have the results back from testing and have completed any treatments that are sent in for you.

## 2024-07-23 ENCOUNTER — Ambulatory Visit: Payer: Self-pay

## 2024-07-23 LAB — CERVICOVAGINAL ANCILLARY ONLY
Bacterial Vaginitis (gardnerella): POSITIVE — AB
Candida Glabrata: NEGATIVE
Candida Vaginitis: POSITIVE — AB
Chlamydia: NEGATIVE
Comment: NEGATIVE
Comment: NEGATIVE
Comment: NEGATIVE
Comment: NEGATIVE
Comment: NEGATIVE
Comment: NORMAL
Neisseria Gonorrhea: NEGATIVE
Trichomonas: NEGATIVE

## 2024-07-23 MED ORDER — FLUCONAZOLE 150 MG PO TABS: 150.0000 mg | ORAL_TABLET | Freq: Once | ORAL | 0 refills | Status: AC

## 2024-08-21 ENCOUNTER — Telehealth: Payer: Self-pay

## 2024-08-21 DIAGNOSIS — F411 Generalized anxiety disorder: Secondary | ICD-10-CM

## 2024-08-21 NOTE — Progress Notes (Signed)
 Complex Care Management Note Care Guide Note  08/21/2024 Name: Deborah Perry MRN: [id number] DOB: [birth year]   Complex Care Management Outreach Attempts: An unsuccessful telephone outreach was attempted today to offer the patient information about available complex care management services.  Follow Up Plan:  Additional outreach attempts will be made to offer the patient complex care management information and services.   Encounter Outcome:  No Answer  Dreama [facility]  [facility], [facility] VBCI Assistant Direct Dial: [phone]  Fax: [phone]

## 2024-08-26 NOTE — Progress Notes (Signed)
 Complex Care Management Note Care Guide Note  08/26/2024 Name: Deborah Perry MRN: [id number] DOB: [birth year]   Complex Care Management Outreach Attempts: A second unsuccessful outreach was attempted today to offer the patient with information about available complex care management services.  Follow Up Plan:  Additional outreach attempts will be made to offer the patient complex care management information and services.   Encounter Outcome:  No Answer  Dreama [facility]wood [facility]  [facility], [facility] VBCI Assistant Direct Dial: [phone]  Fax: [phone]

## 2024-08-26 NOTE — Progress Notes (Signed)
 Thank you for this referral. The [facility] team receives referrals for patients who meet Complex Care Management program criteria: chronic conditions including heart failure, stroke, COPD, ESRD, Sickle Cell, Diabetes with complications, Mental/Behavioral Health diagnosis, substance abuse/misuse and whose Primary Care Provider is a [facility] provider or [facility] contracted Building control surveyor in [place]).  This patient does not have a [facility] or [facility] Primary Care Provider. VBCI Population Health cannot directly provide Care Management services to patients who do not have a qualifying Primary Care Provider. Please call us  if you need further clarification at [phone].    Patient given E2C2 community service line number [phone].  [facility]  [facility], [facility] VBCI Assistant Direct Dial: [phone]  Fax: [phone]

## 2024-09-23 ENCOUNTER — Telehealth: Admitting: Physician Assistant

## 2024-09-23 DIAGNOSIS — B3731 Acute candidiasis of vulva and vagina: Secondary | ICD-10-CM | POA: Diagnosis not present

## 2024-09-23 MED ORDER — FLUCONAZOLE 150 MG PO TABS
150.0000 mg | ORAL_TABLET | Freq: Once | ORAL | 0 refills | Status: AC
Start: 2024-09-23 — End: 2024-09-23

## 2024-09-23 NOTE — Progress Notes (Signed)

## 2024-10-07 ENCOUNTER — Ambulatory Visit
Admission: EM | Admit: 2024-10-07 | Discharge: 2024-10-07 | Disposition: A | Discharge status: Home / Self Care | Attending: Family Medicine | Admitting: Family Medicine

## 2024-10-07 DIAGNOSIS — Z113 Encounter for screening for infections with a predominantly sexual mode of transmission: Secondary | ICD-10-CM | POA: Diagnosis not present

## 2024-10-07 LAB — POCT URINE PREGNANCY: Preg Test, Ur: NEGATIVE

## 2024-10-07 NOTE — ED Triage Notes (Signed)
 Pt requesting STD testing-denies sx/known exposure-NAD-steady gait

## 2024-10-07 NOTE — Discharge Instructions (Signed)
 Will let you know about your test results tomorrow and treat you for any infections you test positive for.

## 2024-10-07 NOTE — ED Provider Notes (Addendum)
  [facility]  Note:  This document was prepared using Conservation officer, historic buildings and may include unintentional dictation errors.  MRN: [id number] DOB: [birth year]  Subjective:   Deborah Perry is a 23 y.o. female presenting for STI screening.  Patient had unprotected sex this past weekend.  Denies fever, n/v, abdominal pain, pelvic pain, rashes, dysuria, urinary frequency, hematuria, vaginal discharge.  Would like pregnancy test.  Declines HIV and syphilis testing.  No current facility-administered medications for this encounter. No current outpatient medications on file.   No Known Allergies  History reviewed. No pertinent past medical history.   Past Surgical History:  Procedure Laterality Date   NO PAST SURGERIES      Family History  Problem Relation Age of Onset   Hypertension Mother    Diabetes Mother    Kidney Stones Mother    Colon cancer Mother    Stroke Maternal Grandfather     Social History   Tobacco Use   Smoking status: Never    Passive exposure: Yes   Smokeless tobacco: Never  Vaping Use   Vaping status: Never Used  Substance Use Topics   Alcohol use: Yes    Comment: occ   Drug use: Yes    Types: Marijuana    ROS   Objective:   Vitals: BP 120/68 (BP Location: Right Arm)   Pulse 61   Temp 98.1 F (36.7 C) (Oral)   Resp 16   LMP 09/08/2024   SpO2 98%   Physical Exam Constitutional:      General: She is not in acute distress.    Appearance: Normal appearance. She is well-developed. She is not ill-appearing, toxic-appearing or diaphoretic.  HENT:     Head: Normocephalic and atraumatic.     Nose: Nose normal.     Mouth/Throat:     Mouth: Mucous membranes are moist.  Eyes:     General: No scleral icterus.       Right eye: No discharge.        Left eye: No discharge.     Extraocular Movements: Extraocular movements intact.  Cardiovascular:     Rate and Rhythm: Normal rate.  Pulmonary:     Effort:  Pulmonary effort is normal.  Skin:    General: Skin is warm and dry.  Neurological:     General: No focal deficit present.     Mental Status: She is alert and oriented to person, place, and time.  Psychiatric:        Mood and Affect: Mood normal.        Behavior: Behavior normal.     Results for orders placed or performed during the hospital encounter of 10/07/24 (from the past 24 hours)  POCT urine pregnancy     Status: None   Collection Time: 10/07/24 10:53 AM  Result Value Ref Range   Preg Test, Ur Negative Negative    Assessment and Plan :   PDMP not reviewed this encounter.  1. Screen for STD (sexually transmitted disease)    STI check pending.   Christopher Savannah, PA-C 10/07/24 1057    Christopher Savannah, [place] 10/07/24 1058

## 2024-10-08 LAB — CERVICOVAGINAL ANCILLARY ONLY
Chlamydia: NEGATIVE
Comment: NEGATIVE
Comment: NEGATIVE
Comment: NORMAL
Neisseria Gonorrhea: NEGATIVE
Trichomonas: NEGATIVE

## 2024-10-19 ENCOUNTER — Telehealth: Admitting: Physician Assistant

## 2024-10-19 DIAGNOSIS — B3731 Acute candidiasis of vulva and vagina: Secondary | ICD-10-CM | POA: Diagnosis not present

## 2024-10-19 MED ORDER — FLUCONAZOLE 150 MG PO TABS: 150.0000 mg | ORAL_TABLET | Freq: Every day | ORAL | 0 refills | Status: DC

## 2024-10-19 NOTE — Progress Notes (Signed)

## 2024-10-25 ENCOUNTER — Telehealth: Admitting: Emergency Medicine

## 2024-10-25 DIAGNOSIS — N760 Acute vaginitis: Secondary | ICD-10-CM

## 2024-10-25 MED ORDER — METRONIDAZOLE 500 MG PO TABS
500.0000 mg | ORAL_TABLET | Freq: Two times a day (BID) | ORAL | 0 refills | Status: AC
Start: 2024-10-25 — End: ?

## 2024-10-25 NOTE — Progress Notes (Signed)
 We are sorry that you are not feeling well. Here is how we plan to help! Based on what you shared with me it looks like you: May have a vaginosis due to bacteria  Vaginosis is an inflammation of the vagina that can result in discharge, itching and pain. The cause is usually a change in the normal balance of vaginal bacteria or an infection. Vaginosis can also result from reduced estrogen levels after menopause.  The most common causes of vaginosis are:   Bacterial vaginosis which results from an overgrowth of one on several organisms that are normally present in your vagina.   Yeast infections which are caused by a naturally occurring fungus called candida.   Vaginal atrophy (atrophic vaginosis) which results from the thinning of the vagina from reduced estrogen levels after menopause.   Trichomoniasis which is caused by a parasite and is commonly transmitted by sexual intercourse.  Factors that increase your risk of developing vaginosis include: Medications, such as antibiotics and steroids Uncontrolled diabetes Use of hygiene products such as bubble bath, vaginal spray or vaginal deodorant Douching Wearing damp or tight-fitting clothing Using an intrauterine device (IUD) for birth control Hormonal changes, such as those associated with pregnancy, birth control pills or menopause Sexual activity Having a sexually transmitted infection  Your treatment plan is Metronidazole  or Flagyl  500mg  twice a day for 7 days.  I have electronically sent this prescription into the pharmacy that you have chosen.  Be sure to take all of the medication as directed. Stop taking any medication if you develop a rash, tongue swelling or shortness of breath. Mothers who are breast feeding should consider pumping and discarding their breast milk while on these antibiotics. However, there is no consensus that infant exposure at these doses would be harmful.  Remember that medication creams can weaken latex  condoms.   HOME CARE:  Good hygiene may prevent some types of vaginosis from recurring and may relieve some symptoms:  Avoid baths, hot tubs and whirlpool spas. Rinse soap from your outer genital area after a shower, and dry the area well to prevent irritation. Don't use scented or harsh soaps, such as those with deodorant or antibacterial action. Avoid irritants. These include scented tampons and pads. Wipe from front to back after using the toilet. Doing so avoids spreading fecal bacteria to your vagina.  Other things that may help prevent vaginosis include:  Don't douche. Your vagina doesn't require cleansing other than normal bathing. Repetitive douching disrupts the normal organisms that reside in the vagina and can actually increase your risk of vaginal infection. Douching won't clear up a vaginal infection. Use a latex condom. Both female and female latex condoms may help you avoid infections spread by sexual contact. Wear cotton underwear. Also wear pantyhose with a cotton crotch. If you feel comfortable without it, skip wearing underwear to bed. Yeast thrives in [place] [facility] Your symptoms should improve in the next day or two.  GET HELP RIGHT AWAY IF:  You have pain in your lower abdomen ( pelvic area or over your ovaries) You develop nausea or vomiting You develop a fever Your discharge changes or worsens You have persistent pain with intercourse You develop shortness of breath, a rapid pulse, or you faint.  These symptoms could be signs of problems or infections that need to be evaluated by a medical provider now.  MAKE SURE YOU   Understand these instructions. Will watch your condition. Will get help right away if you are not doing  well or get worse.  Your e-visit answers were reviewed by a board certified advanced clinical practitioner to complete your personal care plan. Depending upon the condition, your plan could have included both over the counter or  prescription medications. Please review your pharmacy choice to make sure that you have choses a pharmacy that is open for you to pick up any needed prescription, Your safety is important to us . If you have drug allergies check your prescription carefully.   You can use MyChart to ask questions about today's visit, request a non-urgent call back, or ask for a work or school excuse for 24 hours related to this e-Visit. If it has been greater than 24 hours you will need to follow up with your provider, or enter a new e-Visit to address those concerns. You will get a MyChart message within the next two days asking about your experience. I hope that your e-visit has been valuable and will speed your recovery.  I have spent 5 minutes in review of e-visit questionnaire, review and updating patient chart, medical decision making and response to patient.   Lamar Schlossman, PA-C

## 2024-10-27 ENCOUNTER — Other Ambulatory Visit: Payer: Self-pay

## 2024-10-27 ENCOUNTER — Encounter (HOSPITAL_COMMUNITY): Payer: Self-pay

## 2024-10-27 ENCOUNTER — Emergency Department (HOSPITAL_COMMUNITY)
Admission: EM | Admit: 2024-10-27 | Discharge: 2024-10-28 | Disposition: A | Discharge status: Home / Self Care | Attending: Emergency Medicine | Admitting: Emergency Medicine

## 2024-10-27 DIAGNOSIS — H5712 Ocular pain, left eye: Secondary | ICD-10-CM | POA: Diagnosis present

## 2024-10-27 DIAGNOSIS — H18822 Corneal disorder due to contact lens, left eye: Secondary | ICD-10-CM | POA: Diagnosis not present

## 2024-10-27 DIAGNOSIS — S0502XA Injury of conjunctiva and corneal abrasion without foreign body, left eye, initial encounter: Secondary | ICD-10-CM

## 2024-10-27 MED ORDER — FLUORESCEIN SODIUM 1 MG OP STRP
1.0000 | ORAL_STRIP | Freq: Once | OPHTHALMIC | Status: AC
  Administered 2024-10-28: 1 via OPHTHALMIC
  Filled 2024-10-27: qty 1

## 2024-10-27 MED ORDER — TETRACAINE HCL 0.5 % OP SOLN
1.0000 [drp] | Freq: Once | OPHTHALMIC | Status: AC
  Administered 2024-10-28: 1 [drp] via OPHTHALMIC
  Filled 2024-10-27: qty 4

## 2024-10-27 MED ORDER — KETOROLAC TROMETHAMINE 0.5 % OP SOLN
1.0000 [drp] | Freq: Four times a day (QID) | OPHTHALMIC | Status: DC
  Administered 2024-10-28: 1 [drp] via OPHTHALMIC
  Filled 2024-10-27: qty 5

## 2024-10-27 MED ORDER — POLYMYXIN B-TRIMETHOPRIM 10000-0.1 UNIT/ML-% OP SOLN
1.0000 [drp] | OPHTHALMIC | Status: DC
  Administered 2024-10-28: 1 [drp] via OPHTHALMIC
  Filled 2024-10-27: qty 10

## 2024-10-27 NOTE — ED Provider Notes (Signed)
 [place] EMERGENCY DEPARTMENT AT [facility] Provider Note   CSN: [id number] Arrival date & time: 10/27/24  2232     Patient presents with: Eye Pain   Deborah Perry is a 23 y.o. female.  {Add pertinent medical, surgical, social history, OB history to YEP:67052} The history is provided by the patient and medical records.  Eye Pain     Prior to Admission medications   Medication Sig Start Date End Date Taking? Authorizing Provider  fluconazole  (DIFLUCAN ) 150 MG tablet Take 1 tablet (150 mg total) by mouth daily. 10/19/24   Ward, Harlene PEDLAR, PA-C  metroNIDAZOLE  (FLAGYL ) 500 MG tablet Take 1 tablet (500 mg total) by mouth 2 (two) times daily. 10/25/24   Vicky Charleston, PA-C    Allergies: Patient has no known allergies.    Review of Systems  Eyes:  Positive for pain.  All other systems reviewed and are negative.   Updated Vital Signs BP 136/84 (BP Location: Right Arm)   Pulse 89   Temp 98.3 F (36.8 C) (Oral)   Resp 16   LMP 09/08/2024   SpO2 100%   Physical Exam Vitals and nursing note reviewed.  Constitutional:      Appearance: She is well-developed.  HENT:     Head: Normocephalic and atraumatic.  Eyes:     Conjunctiva/sclera: Conjunctivae normal.     Pupils: Pupils are equal, round, and reactive to light.     Comments: False lashes on left eye have been removed, no lid edema or erythema, no visible foreign body, fluorescein  stain with small abrasion along lower portion of the iris, no uptake, EOMs intact  Cardiovascular:     Rate and Rhythm: Normal rate and regular rhythm.     Heart sounds: Normal heart sounds.  Pulmonary:     Effort: Pulmonary effort is normal.     Breath sounds: Normal breath sounds.  Abdominal:     General: Bowel sounds are normal.     Palpations: Abdomen is soft.  Musculoskeletal:        General: Normal range of motion.     Cervical back: Normal range of motion.  Skin:    General: Skin is warm and dry.   Neurological:     Mental Status: She is alert and oriented to person, place, and time.     (all labs ordered are listed, but only abnormal results are displayed) Labs Reviewed - No data to display  EKG: None  Radiology: No results found.  {Document cardiac monitor, telemetry assessment procedure when appropriate:32947} Procedures   Medications Ordered in the ED  tetracaine  (PONTOCAINE) 0.5 % ophthalmic solution 1 drop (has no administration in time range)  fluorescein  ophthalmic strip 1 strip (has no administration in time range)      {Click here for ABCD2, HEART and other calculators REFRESH Note before signing:1}                              Medical Decision Making Risk Prescription drug management.   ***  {Document critical care time when appropriate  Document review of labs and clinical decision tools ie CHADS2VASC2, etc  Document your independent review of radiology images and any outside records  Document your discussion with family members, caretakers and with consultants  Document social determinants of health affecting pt's care  Document your decision making why or why not admission, treatments were needed:32947:::1}   Final diagnoses:  None  ED Discharge Orders     None

## 2024-10-27 NOTE — ED Triage Notes (Signed)
 Pt reports with left eye pain after getting her lashes done today. Pt took off the lashes and her eye continue to hurt, pt states that it feels like something is in her eye.

## 2024-10-28 NOTE — Discharge Instructions (Signed)
 Continue using drops as directed. Avoid replacing lashes until eye is fully healed. Follow-up with eye doctor-- I would call in the morning to get this arranged. Return to the ED for new or worsening symptoms.

## 2024-11-15 ENCOUNTER — Ambulatory Visit: Admitting: Obstetrics and Gynecology

## 2024-11-25 ENCOUNTER — Telehealth: Admitting: Family Medicine

## 2024-11-25 DIAGNOSIS — B3731 Acute candidiasis of vulva and vagina: Secondary | ICD-10-CM

## 2024-11-25 MED ORDER — FLUCONAZOLE 150 MG PO TABS: 150.0000 mg | ORAL_TABLET | Freq: Every day | ORAL | 0 refills | Status: DC

## 2024-11-25 NOTE — Progress Notes (Signed)

## 2024-12-11 ENCOUNTER — Emergency Department (HOSPITAL_COMMUNITY)

## 2024-12-11 ENCOUNTER — Other Ambulatory Visit: Payer: Self-pay

## 2024-12-11 ENCOUNTER — Emergency Department (HOSPITAL_COMMUNITY)
Admission: EM | Admit: 2024-12-11 | Discharge: 2024-12-12 | Disposition: A | Discharge status: Home / Self Care | Attending: Emergency Medicine | Admitting: Emergency Medicine

## 2024-12-11 DIAGNOSIS — S63501A Unspecified sprain of right wrist, initial encounter: Secondary | ICD-10-CM | POA: Insufficient documentation

## 2024-12-11 DIAGNOSIS — W010XXA Fall on same level from slipping, tripping and stumbling without subsequent striking against object, initial encounter: Secondary | ICD-10-CM | POA: Diagnosis not present

## 2024-12-11 DIAGNOSIS — S6991XA Unspecified injury of right wrist, hand and finger(s), initial encounter: Secondary | ICD-10-CM | POA: Diagnosis present

## 2024-12-11 MED ORDER — ACETAMINOPHEN 325 MG PO TABS
650.0000 mg | ORAL_TABLET | Freq: Once | ORAL | Status: AC
  Administered 2024-12-11: 650 mg via ORAL
  Filled 2024-12-11: qty 2

## 2024-12-11 NOTE — ED Triage Notes (Signed)
 Patient c/o wrist injury. Patient stated she tripped and fell on her right hand. Patient c/o 10/10 wrist pain.

## 2024-12-12 NOTE — Discharge Instructions (Signed)
 As we discussed, no fracture is seen on your xray. Wear the splint for comfort and stability until soreness is improved. Recommend ibuprofen  600 mg (3 of the over-the-counter strength tablets) every 6 hours.   If pain is not improving after 5 days, call Dr. Kyon Bentler to schedule a recheck appointment.

## 2024-12-12 NOTE — ED Provider Notes (Signed)
 " Deborah Perry Provider Note   CSN: [id number] Arrival date & time: 12/11/24  2313     Patient presents with: Hand Injury and Wrist Pain   Deborah Perry is a 24 y.o. female.   Patient to ED after fall earlier tonight with right wrist and hand injury. She states she tripped and fell forward onto outstretched hand. No other injury. She is left hand dominant.  The history is provided by the patient. No language interpreter was used.  Hand Injury Wrist Pain       Prior to Admission medications  Medication Sig Start Date End Date Taking? Authorizing Provider  fluconazole  (DIFLUCAN ) 150 MG tablet Take 1 tablet (150 mg total) by mouth daily. 11/25/24   Blair, Diane W, FNP  metroNIDAZOLE  (FLAGYL ) 500 MG tablet Take 1 tablet (500 mg total) by mouth 2 (two) times daily. 10/25/24   Vicky Charleston, PA-C    Allergies: Patient has no known allergies.    Review of Systems  Updated Vital Signs BP (!) 101/44 (BP Location: Left Arm)   Pulse (!) 51   Temp 98.4 F (36.9 C)   Resp 17   SpO2 100%   Physical Exam Vitals and nursing note reviewed.  Constitutional:      Appearance: Normal appearance.  Cardiovascular:     Rate and Rhythm: Normal rate.  Pulmonary:     Effort: Pulmonary effort is normal.  Musculoskeletal:     Comments: Right wrist has no significant swelling or deformity. Tender dorsally. There is swelling without discoloration or wound to central dorsum of hand. All digits flex and extend. Neurovascularly intact. No palmar discoloration or tenderness.   Skin:    General: Skin is warm and dry.  Neurological:     Mental Status: She is alert.     Sensory: No sensory deficit.     (all labs ordered are listed, but only abnormal results are displayed) Labs Reviewed - No data to display  EKG: None  Radiology: DG Wrist Complete Right Result Date: 12/11/2024 CLINICAL DATA:  Fall on outstretched arm EXAM: RIGHT WRIST -  COMPLETE 3+ VIEW COMPARISON:  02/04/2006 FINDINGS: There is no evidence of fracture or dislocation. There is no evidence of arthropathy or other focal bone abnormality. Soft tissues are unremarkable. IMPRESSION: Negative. Electronically Signed   By: Luke Bun M.D.   On: 12/11/2024 23:47     Procedures   Medications Ordered in the ED  acetaminophen  (TYLENOL ) tablet 650 mg (650 mg Oral Given 12/11/24 2348)    Clinical Course as of 12/12/24 0344  Thu Dec 12, 2024  0335 Patient with fall earlier tonight c/o right wrist and hand pain after landing onto outstretched hand. Imaging of the wrist obtained and is negative. Able to visualize the metacarpals in the area of concern and no fracture seen. Will treat with supportive care. Wrist splint (velcro) [SU]    Clinical Course User Index [SU] Odell Balls, PA-C                                 Medical Decision Making Amount and/or Complexity of Data Reviewed Radiology: ordered.  Risk OTC drugs.        Final diagnoses:  Sprain of right wrist, unspecified location, initial encounter    ED Discharge Orders     None          Odell Balls, PA-C 12/12/24 9655  Raford Lenis, MD 12/12/24 [phone]  "

## 2024-12-16 ENCOUNTER — Ambulatory Visit
Admission: EM | Admit: 2024-12-16 | Discharge: 2024-12-16 | Disposition: A | Discharge status: Home / Self Care | Source: Home / Self Care

## 2024-12-16 DIAGNOSIS — N949 Unspecified condition associated with female genital organs and menstrual cycle: Secondary | ICD-10-CM | POA: Diagnosis not present

## 2024-12-16 DIAGNOSIS — Z113 Encounter for screening for infections with a predominantly sexual mode of transmission: Secondary | ICD-10-CM | POA: Insufficient documentation

## 2024-12-16 NOTE — Discharge Instructions (Signed)
 We will let you know about your test results in 1-2 business days and treat you for any infections you test positive for.

## 2024-12-16 NOTE — ED Triage Notes (Signed)
 Pt requesting STD testing-denies sx and known exposure-NAD-steady gait

## 2024-12-16 NOTE — ED Provider Notes (Signed)
" °  [facility]  Note:  This document was prepared using Conservation officer, historic buildings and may include unintentional dictation errors.  MRN: [id number] DOB: 2001-07-20  Subjective:   Deborah Perry is a 24 y.o. female presenting for complete STI testing.  Patient reports a solitary bump in the vaginal area that she would like evaluated and tested for HSV.  Denies fever, n/v, abdominal pain, pelvic pain, dysuria, urinary frequency, hematuria, vaginal discharge.  No known exposures.   Current Outpatient Medications  Medication Instructions   fluconazole  (DIFLUCAN ) 150 mg, Oral, Daily   metroNIDAZOLE  (FLAGYL ) 500 mg, Oral, 2 times daily    Allergies[1]  History reviewed. No pertinent past medical history.   Past Surgical History:  Procedure Laterality Date   NO PAST SURGERIES      Family History  Problem Relation Age of Onset   Hypertension Mother    Diabetes Mother    Kidney Stones Mother    Colon cancer Mother    Stroke Maternal Grandfather     Social History   Occupational History   Not on file  Tobacco Use   Smoking status: Never    Passive exposure: Yes   Smokeless tobacco: Never  Vaping Use   Vaping status: Never Used  Substance and Sexual Activity   Alcohol use: Yes    Comment: occ   Drug use: Yes    Types: Marijuana   Sexual activity: Yes    Partners: Male    Birth control/protection: None     ROS   Objective:   Vitals: BP 125/81 (BP Location: Right Arm)   Pulse 71   Temp 98.4 F (36.9 C) (Oral)   Resp 16   LMP 12/16/2024   SpO2 99%   Physical Exam Exam conducted with a chaperone present BANKER Presnell).  Constitutional:      General: She is not in acute distress.    Appearance: Normal appearance. She is well-developed. She is not ill-appearing, toxic-appearing or diaphoretic.  HENT:     Head: Normocephalic and atraumatic.     Nose: Nose normal.     Mouth/Throat:     Mouth: Mucous membranes are moist.  Eyes:      General: No scleral icterus.       Right eye: No discharge.        Left eye: No discharge.     Extraocular Movements: Extraocular movements intact.  Cardiovascular:     Rate and Rhythm: Normal rate.  Pulmonary:     Effort: Pulmonary effort is normal.  Genitourinary:  Skin:    General: Skin is warm and dry.  Neurological:     General: No focal deficit present.     Mental Status: She is alert and oriented to person, place, and time.  Psychiatric:        Mood and Affect: Mood normal.        Behavior: Behavior normal.     Assessment and Plan :   PDMP not reviewed this encounter.  1. Screening examination for STI   2. Genital lesion, female      Offered lancing the lesion but patient declined.  Recommend warm compresses at home.  Complete STI check pending.  Will treat as appropriate based off of results.    [1] No Known Allergies    Christopher Savannah, PA-C 12/16/24 1316  "

## 2024-12-17 LAB — CERVICOVAGINAL ANCILLARY ONLY
Chlamydia: NEGATIVE
Comment: NEGATIVE
Comment: NEGATIVE
Comment: NORMAL
Neisseria Gonorrhea: NEGATIVE
Trichomonas: NEGATIVE

## 2024-12-17 LAB — SYPHILIS: RPR W/REFLEX TO RPR TITER AND TREPONEMAL ANTIBODIES, TRADITIONAL SCREENING AND DIAGNOSIS ALGORITHM: RPR Ser Ql: NONREACTIVE

## 2024-12-17 LAB — HSV 1/2 PCR (SURFACE)
HSV-1 DNA: NOT DETECTED
HSV-2 DNA: NOT DETECTED

## 2024-12-17 LAB — HIV ANTIBODY (ROUTINE TESTING W REFLEX): HIV Screen 4th Generation wRfx: NONREACTIVE

## 2024-12-26 ENCOUNTER — Telehealth: Admitting: Physician Assistant

## 2024-12-26 DIAGNOSIS — T3695XA Adverse effect of unspecified systemic antibiotic, initial encounter: Secondary | ICD-10-CM

## 2024-12-26 DIAGNOSIS — B379 Candidiasis, unspecified: Secondary | ICD-10-CM | POA: Diagnosis not present

## 2024-12-26 MED ORDER — FLUCONAZOLE 150 MG PO TABS: ORAL_TABLET | ORAL | 0 refills | Status: AC

## 2024-12-26 NOTE — Progress Notes (Signed)
